# Patient Record
Sex: Male | Born: 1965 | Race: Black or African American | Hispanic: No | Smoking: Never smoker
Health system: Southern US, Community
[De-identification: ages and names within clinical notes are randomized; demographics above are authoritative.]

## PROBLEM LIST (undated history)

## (undated) DIAGNOSIS — E7800 Pure hypercholesterolemia, unspecified: Secondary | ICD-10-CM

## (undated) DIAGNOSIS — I10 Essential (primary) hypertension: Secondary | ICD-10-CM

## (undated) DIAGNOSIS — E119 Type 2 diabetes mellitus without complications: Secondary | ICD-10-CM

## (undated) HISTORY — PX: CARDIAC SURGERY: SHX584

## (undated) HISTORY — PX: TOTAL HIP ARTHROPLASTY: SHX124

## (undated) HISTORY — PX: NEPHRECTOMY TRANSPLANTED ORGAN: SUR880

---

## 2021-06-11 ENCOUNTER — Inpatient Hospital Stay
Admission: EM | Admit: 2021-06-11 | Discharge: 2021-06-13 | DRG: 492 | Disposition: A | Discharge status: Home / Self Care | Payer: 59 | Attending: Internal Medicine | Admitting: Internal Medicine

## 2021-06-11 ENCOUNTER — Emergency Department: Payer: 59

## 2021-06-11 ENCOUNTER — Other Ambulatory Visit: Payer: Self-pay

## 2021-06-11 DIAGNOSIS — Z23 Encounter for immunization: Secondary | ICD-10-CM

## 2021-06-11 DIAGNOSIS — I252 Old myocardial infarction: Secondary | ICD-10-CM | POA: Diagnosis not present

## 2021-06-11 DIAGNOSIS — Z7960 Long term (current) use of unspecified immunomodulators and immunosuppressants: Secondary | ICD-10-CM | POA: Diagnosis not present

## 2021-06-11 DIAGNOSIS — E785 Hyperlipidemia, unspecified: Secondary | ICD-10-CM | POA: Diagnosis present

## 2021-06-11 DIAGNOSIS — S82202A Unspecified fracture of shaft of left tibia, initial encounter for closed fracture: Secondary | ICD-10-CM

## 2021-06-11 DIAGNOSIS — Z880 Allergy status to penicillin: Secondary | ICD-10-CM

## 2021-06-11 DIAGNOSIS — Z881 Allergy status to other antibiotic agents status: Secondary | ICD-10-CM

## 2021-06-11 DIAGNOSIS — S82202B Unspecified fracture of shaft of left tibia, initial encounter for open fracture type I or II: Secondary | ICD-10-CM

## 2021-06-11 DIAGNOSIS — Y929 Unspecified place or not applicable: Secondary | ICD-10-CM

## 2021-06-11 DIAGNOSIS — E119 Type 2 diabetes mellitus without complications: Secondary | ICD-10-CM

## 2021-06-11 DIAGNOSIS — S82832B Other fracture of upper and lower end of left fibula, initial encounter for open fracture type I or II: Secondary | ICD-10-CM | POA: Diagnosis present

## 2021-06-11 DIAGNOSIS — Z794 Long term (current) use of insulin: Secondary | ICD-10-CM

## 2021-06-11 DIAGNOSIS — Y830 Surgical operation with transplant of whole organ as the cause of abnormal reaction of the patient, or of later complication, without mention of misadventure at the time of the procedure: Secondary | ICD-10-CM | POA: Diagnosis present

## 2021-06-11 DIAGNOSIS — N186 End stage renal disease: Secondary | ICD-10-CM | POA: Diagnosis present

## 2021-06-11 DIAGNOSIS — D84821 Immunodeficiency due to drugs: Secondary | ICD-10-CM | POA: Diagnosis present

## 2021-06-11 DIAGNOSIS — Z20822 Contact with and (suspected) exposure to covid-19: Secondary | ICD-10-CM | POA: Diagnosis present

## 2021-06-11 DIAGNOSIS — S82302B Unspecified fracture of lower end of left tibia, initial encounter for open fracture type I or II: Secondary | ICD-10-CM | POA: Diagnosis present

## 2021-06-11 DIAGNOSIS — T8611 Kidney transplant rejection: Secondary | ICD-10-CM | POA: Diagnosis present

## 2021-06-11 DIAGNOSIS — I10 Essential (primary) hypertension: Secondary | ICD-10-CM | POA: Diagnosis present

## 2021-06-11 DIAGNOSIS — Z419 Encounter for procedure for purposes other than remedying health state, unspecified: Secondary | ICD-10-CM

## 2021-06-11 DIAGNOSIS — E1169 Type 2 diabetes mellitus with other specified complication: Secondary | ICD-10-CM | POA: Diagnosis present

## 2021-06-11 DIAGNOSIS — S82402B Unspecified fracture of shaft of left fibula, initial encounter for open fracture type I or II: Secondary | ICD-10-CM

## 2021-06-11 DIAGNOSIS — I120 Hypertensive chronic kidney disease with stage 5 chronic kidney disease or end stage renal disease: Secondary | ICD-10-CM | POA: Diagnosis present

## 2021-06-11 DIAGNOSIS — Z940 Kidney transplant status: Secondary | ICD-10-CM

## 2021-06-11 DIAGNOSIS — T8610 Unspecified complication of kidney transplant: Secondary | ICD-10-CM

## 2021-06-11 DIAGNOSIS — Z96643 Presence of artificial hip joint, bilateral: Secondary | ICD-10-CM | POA: Diagnosis present

## 2021-06-11 DIAGNOSIS — E7800 Pure hypercholesterolemia, unspecified: Secondary | ICD-10-CM | POA: Diagnosis present

## 2021-06-11 HISTORY — DX: Type 2 diabetes mellitus without complications: E11.9

## 2021-06-11 HISTORY — DX: Pure hypercholesterolemia, unspecified: E78.00

## 2021-06-11 HISTORY — DX: Essential (primary) hypertension: I10

## 2021-06-11 LAB — RESP PANEL BY RT-PCR (FLU A&B, COVID) ARPGX2
Influenza A by PCR: NEGATIVE
Influenza B by PCR: NEGATIVE
SARS Coronavirus 2 by RT PCR: NEGATIVE

## 2021-06-11 LAB — CBC
HCT: 44.5 % (ref 39.0–52.0)
Hemoglobin: 14.4 g/dL (ref 13.0–17.0)
MCH: 28 pg (ref 26.0–34.0)
MCHC: 32.4 g/dL (ref 30.0–36.0)
MCV: 86.6 fL (ref 80.0–100.0)
Platelets: 194 10*3/uL (ref 150–400)
RBC: 5.14 MIL/uL (ref 4.22–5.81)
RDW: 12.6 % (ref 11.5–15.5)
WBC: 6.6 10*3/uL (ref 4.0–10.5)
nRBC: 0 % (ref 0.0–0.2)

## 2021-06-11 LAB — GLUCOSE, CAPILLARY: Glucose-Capillary: 113 mg/dL — ABNORMAL HIGH (ref 70–99)

## 2021-06-11 LAB — BASIC METABOLIC PANEL
Anion gap: 6 (ref 5–15)
BUN: 20 mg/dL (ref 6–20)
CO2: 25 mmol/L (ref 22–32)
Calcium: 10.1 mg/dL (ref 8.9–10.3)
Chloride: 106 mmol/L (ref 98–111)
Creatinine, Ser: 1.15 mg/dL (ref 0.61–1.24)
GFR, Estimated: >60 mL/min (ref >60)
Glucose, Bld: 157 mg/dL — ABNORMAL HIGH (ref 70–99)
Potassium: 3.7 mmol/L (ref 3.5–5.1)
Sodium: 137 mmol/L (ref 135–145)

## 2021-06-11 LAB — PROTIME-INR
INR: 1 (ref 0.8–1.2)
Prothrombin Time: 12.8 seconds (ref 11.4–15.2)

## 2021-06-11 LAB — CBG MONITORING, ED: Glucose-Capillary: 119 mg/dL — ABNORMAL HIGH (ref 70–99)

## 2021-06-11 MED ORDER — ONDANSETRON HCL 4 MG/2ML IJ SOLN: 4.0000 mg | Freq: Four times a day (QID) | INTRAMUSCULAR | Status: DC | PRN

## 2021-06-11 MED ORDER — INSULIN ASPART 100 UNIT/ML IJ SOLN
0.0000 [IU] | Freq: Three times a day (TID) | INTRAMUSCULAR | Status: DC
  Administered 2021-06-12: 1 [IU] via SUBCUTANEOUS
  Administered 2021-06-13: 3 [IU] via SUBCUTANEOUS
  Administered 2021-06-13: 5 [IU] via SUBCUTANEOUS
  Administered 2021-06-13: 1 [IU] via SUBCUTANEOUS
  Filled 2021-06-11 (×4): qty 1

## 2021-06-11 MED ORDER — ONDANSETRON HCL 4 MG/2ML IJ SOLN
4.0000 mg | INTRAMUSCULAR | Status: AC
  Administered 2021-06-11: 4 mg via INTRAVENOUS
  Filled 2021-06-11: qty 2

## 2021-06-11 MED ORDER — KETAMINE HCL 50 MG/5ML IJ SOSY
PREFILLED_SYRINGE | INTRAMUSCULAR | Status: AC
  Filled 2021-06-11: qty 5

## 2021-06-11 MED ORDER — ACETAMINOPHEN 325 MG PO TABS: 650.0000 mg | ORAL_TABLET | Freq: Four times a day (QID) | ORAL | Status: DC | PRN

## 2021-06-11 MED ORDER — MAGNESIUM HYDROXIDE 400 MG/5ML PO SUSP: 30.0000 mL | Freq: Every day | ORAL | Status: DC | PRN

## 2021-06-11 MED ORDER — ACETAMINOPHEN 650 MG RE SUPP: 650.0000 mg | Freq: Four times a day (QID) | RECTAL | Status: DC | PRN

## 2021-06-11 MED ORDER — OXYCODONE-ACETAMINOPHEN 5-325 MG PO TABS
1.0000 | ORAL_TABLET | ORAL | Status: DC | PRN
  Administered 2021-06-11: 1 via ORAL
  Administered 2021-06-12: 2 via ORAL
  Filled 2021-06-11 (×2): qty 2

## 2021-06-11 MED ORDER — CEFAZOLIN SODIUM-DEXTROSE 2-4 GM/100ML-% IV SOLN: 2.0000 g | Freq: Once | INTRAVENOUS | Status: DC

## 2021-06-11 MED ORDER — CLINDAMYCIN PHOSPHATE 900 MG/50ML IV SOLN
900.0000 mg | Freq: Once | INTRAVENOUS | Status: AC
  Administered 2021-06-11: 900 mg via INTRAVENOUS
  Filled 2021-06-11: qty 50

## 2021-06-11 MED ORDER — TRAZODONE HCL 50 MG PO TABS
25.0000 mg | ORAL_TABLET | Freq: Every evening | ORAL | Status: DC | PRN
  Administered 2021-06-12: 25 mg via ORAL
  Filled 2021-06-11: qty 1

## 2021-06-11 MED ORDER — ONDANSETRON HCL 4 MG PO TABS: 4.0000 mg | ORAL_TABLET | Freq: Four times a day (QID) | ORAL | Status: DC | PRN

## 2021-06-11 MED ORDER — TETANUS-DIPHTH-ACELL PERTUSSIS 5-2.5-18.5 LF-MCG/0.5 IM SUSY
0.5000 mL | PREFILLED_SYRINGE | Freq: Once | INTRAMUSCULAR | Status: AC
  Administered 2021-06-11: 0.5 mL via INTRAMUSCULAR
  Filled 2021-06-11: qty 0.5

## 2021-06-11 MED ORDER — LABETALOL HCL 5 MG/ML IV SOLN: 20.0000 mg | INTRAVENOUS | Status: DC | PRN

## 2021-06-11 MED ORDER — MORPHINE SULFATE (PF) 2 MG/ML IV SOLN
2.0000 mg | INTRAVENOUS | Status: DC | PRN
  Administered 2021-06-12 (×3): 2 mg via INTRAVENOUS
  Filled 2021-06-11 (×3): qty 1

## 2021-06-11 MED ORDER — SODIUM CHLORIDE 0.9 % IV SOLN: INTRAVENOUS | Status: DC

## 2021-06-11 MED ORDER — MORPHINE SULFATE (PF) 2 MG/ML IV SOLN
2.0000 mg | Freq: Once | INTRAVENOUS | Status: AC
  Administered 2021-06-11: 2 mg via INTRAVENOUS
  Filled 2021-06-11: qty 1

## 2021-06-11 MED ORDER — KETAMINE HCL 50 MG/5ML IJ SOSY
1.0000 mg/kg | PREFILLED_SYRINGE | Freq: Once | INTRAMUSCULAR | Status: AC
  Administered 2021-06-11: 82 mg via INTRAVENOUS
  Filled 2021-06-11: qty 10

## 2021-06-11 NOTE — ED Triage Notes (Signed)
Pt comes via EMS with c/o left leg injury. Pt was helping load things when his family accidentally back the [place] and his leg got caught between the ramp and steps.  VSS. Pt refused pain meds en route.  Pt has noticeable fracture to left lower leg. Laceration noted, bleeding controlled.

## 2021-06-11 NOTE — ED Notes (Signed)
X-ray at bedside

## 2021-06-11 NOTE — Sedation Documentation (Signed)
Pedal pulse found by [employer], Medic. Sharlet Salina, MD aware.

## 2021-06-11 NOTE — H&P (Signed)
[place]   PATIENT NAMEJahquan Riggs    MR#:  [id number]  DATE OF BIRTH:  02-May-1966  DATE OF ADMISSION:  06/11/2021  PRIMARY CARE PHYSICIAN: Pcp, No   Patient is coming from: Home  REQUESTING/REFERRING PHYSICIAN: Delman Kitten, MD  CHIEF COMPLAINT:   Chief Complaint  Patient presents with  . Leg Injury    HISTORY OF PRESENT ILLNESS:  James Riggs is a 55 y.o. African-American male with medical history significant for type II diabetes mellitus, history of MI, hypertension, dyslipidemia and end-stage renal disease status post renal transplant on immunosuppressive therapy, who presented to the ER with acute onset of left leg injury and she was helping in moving earlier today.  The moving Lucianne Lei with his left leg down back into his left leg and pended with subsequent immediate pain.  He did not fall.  He was freed easily with subsequent severe deformity at his left ankle.  He denied any weakness or numbness of the left foot.  ED Course: When he came to the ER, vital signs were within normal and later blood pressure was elevated to 104/149 likely secondary to pain and later on was within normal.  Labs revealed a blood glucose of 157 with otherwise unremarkable BMP and CBC.  Influenza antigens and COVID-19 PCR came back negative.   EKG as reviewed by me : Pending Imaging: Left tib-fib x-ray revealed displaced and angulated fracture of the distal tibial and fibular diaphysis with no dislocation.  He underwent reduction with IV ketamine and pain therapy.  Repeat x-ray showed improved angulation of the distal tibia and fibula fracture with unchanged lateral displacement.  The patient was given 50 mg of IV ketamine, 4 mg of IV Zofran and 2 mg of IV morphine sulfate.  His Tdap was updated.  He was seen by Dr. Sharlet Salina who was planning on ORIF tomorrow.  The patient will be admitted to surgical telemetry bed for further evaluation and management. PAST MEDICAL HISTORY:   Past Medical  History:  Diagnosis Date  . Diabetes mellitus without complication ([place])   . Hypercholesteremia   . Hypertension     PAST SURGICAL HISTORY:   Past Surgical History:  Procedure Laterality Date  . CARDIAC SURGERY    . NEPHRECTOMY TRANSPLANTED ORGAN    . TOTAL HIP ARTHROPLASTY Bilateral     SOCIAL HISTORY:   Social History   Tobacco Use  . Smoking status: Not on file  . Smokeless tobacco: Not on file  Substance Use Topics  . Alcohol use: Not on file    FAMILY HISTORY:  No family history on file.  No pertinent familial diseases.  DRUG ALLERGIES:   Allergies  Allergen Reactions  . Aspirin   . Elemental Sulfur   . Penicillins   . Sulfa Antibiotics   . Sulfites   . Sulfonylureas   . Tetracyclines & Related     REVIEW OF SYSTEMS:   ROS As per history of present illness. All pertinent systems were reviewed above. Constitutional, HEENT, cardiovascular, respiratory, GI, GU, musculoskeletal, neuro, psychiatric, endocrine, integumentary and hematologic systems were reviewed and are otherwise negative/unremarkable except for positive findings mentioned above in the HPI.   MEDICATIONS AT HOME:   Prior to Admission medications   Not on File      VITAL SIGNS:  Blood pressure 121/83, pulse 85, temperature 98.3 F (36.8 C), temperature source Oral, resp. rate 16, height 5\' 9"  (1.753 m), weight 81.6 kg, SpO2 96 %.  PHYSICAL  EXAMINATION:  Physical Exam  GENERAL:  55 y.o.-year-old African-American male patient lying in the bed with no acute distress.  EYES: Pupils equal, round, reactive to light and accommodation. No scleral icterus. Extraocular muscles intact.  HEENT: Head atraumatic, normocephalic. Oropharynx and nasopharynx clear.  NECK:  Supple, no jugular venous distention. No thyroid enlargement, no tenderness.  LUNGS: Normal breath sounds bilaterally, no wheezing, rales,rhonchi or crepitation. No use of accessory muscles of respiration.  CARDIOVASCULAR: Regular  rate and rhythm, S1, S2 normal. No murmurs, rubs, or gallops.  ABDOMEN: Soft, nondistended, nontender. Bowel sounds present. No organomegaly or mass.  EXTREMITIES: No pedal edema, cyanosis, or clubbing. Musculoskeletal: Left lower extremity was splinted. NEUROLOGIC: Cranial nerves II through XII are intact. Muscle strength 5/5 in all extremities. Sensation intact. Gait not checked.  PSYCHIATRIC: The patient is alert and oriented x 3.  Normal affect and good eye contact. SKIN: No obvious rash, lesion, or ulcer.   LABORATORY PANEL:   CBC Recent Labs  Lab 06/11/21 1739  WBC 6.6  HGB 14.4  HCT 44.5  PLT 194   ------------------------------------------------------------------------------------------------------------------  Chemistries  Recent Labs  Lab 06/11/21 1739  NA 137  K 3.7  CL 106  CO2 25  GLUCOSE 157*  BUN 20  CREATININE 1.15  CALCIUM 10.1   ------------------------------------------------------------------------------------------------------------------  Cardiac Enzymes No results for input(s): TROPONINI in the last 168 hours. ------------------------------------------------------------------------------------------------------------------  RADIOLOGY:  DG Tibia/Fibula Left  Result Date: 06/11/2021 CLINICAL DATA:  Status post reduction EXAM: LEFT TIBIA AND FIBULA - 2 VIEW COMPARISON:  None. FINDINGS: Improved angulation of distal tibia and fibula fractures. There is unchanged lateral displacement of both fractures. There is mild persistent anterior angulation. IMPRESSION: Improved angulation of distal tibia and fibula fractures. Unchanged lateral displacement. Electronically Signed   By: Ulyses Jarred M.D.   On: 06/11/2021 19:41   DG Tibia/Fibula Left  Result Date: 06/11/2021 CLINICAL DATA:  Fracture of the left lower extremity EXAM: LEFT TIBIA AND FIBULA - 2 VIEW COMPARISON:  None. FINDINGS: Displaced and angulated fractures of the distal tibial and fibular  diaphysis. There is lateral angulation and displacement of the distal fracture fragments as well as anterior displacement. No dislocation. The bones are well mineralized. Sclerotic changes of the lateral femoral condyle may be chronic or represent bone contusion versus less likely infarct. Mild soft tissue swelling of the distal leg. No opaque foreign object or soft tissue gas. IMPRESSION: Displaced and angulated fractures of the distal tibial and fibular diaphysis. No dislocation. Electronically Signed   By: Anner Crete M.D.   On: 06/11/2021 17:25      IMPRESSION AND PLAN:  Principal Problem:   Tibia/fibula fracture, left, closed, initial encounter  1.  Open left tibia/fibula fracture. - The patient will be admitted to a surgical telemetry bed. - Pain management will be provided. - Orthopedic consultation was obtained. - Dr. Sharlet Salina is planning for ORIF tomorrow. - The patient has diabetes mellitus on insulin with previous history of MI and no history of CHF, CVA or renal failure.  Per the revised cardiac risk index he is considered above average risk for his age for cardiovascular events.  He has no current pulmonary issues.  He is currently asymptomatic apart from his left lower extremity pain.  2.  Status post renal transplant. - We will continue his immunosuppressive therapy.  3.  Type 2 diabetes mellitus. - The patient will be placed on supplement coverage with NovoLog. - We will continue his basal coverage and his Jardiance.  4.  Dyslipidemia. - We will continue statin therapy.  5.  Essential hypertension. - We will continue Cozaar and hydralazine.   DVT prophylaxis: SCDs. Code Status: full code. Family Communication:  The plan of care was discussed in details with the patient (and family). I answered all questions. The patient agreed to proceed with the above mentioned plan. Further management will depend upon hospital course. Disposition Plan: Back to previous home  environment Consults called: Orthopedic consult. All the records are reviewed and case discussed with ED provider.  Status is: Inpatient   Remains inpatient appropriate because:Ongoing diagnostic testing needed not appropriate for outpatient work up, Unsafe d/c plan, IV treatments appropriate due to intensity of illness or inability to take PO, and Inpatient level of care appropriate due to severity of illness   Dispo: The patient is from: Home              Anticipated d/c is to: Home              Patient currently is not medically stable to d/c.              Difficult to place patient: No  TOTAL TIME TAKING CARE OF THIS PATIENT: 55 minutes.     Christel Mormon M.D on 06/11/2021 at 8:42 PM  [facility]   From 7 PM-7 AM, contact night-coverage [email or web address]  CC: Primary care physician; Pcp, No

## 2021-06-11 NOTE — ED Notes (Signed)
Multiple attempts for IV with no success. IV team consult placed at this time.

## 2021-06-11 NOTE — Consult Note (Signed)
ORTHOPAEDIC CONSULTATION  REQUESTING PHYSICIAN: Mansy, Arvella Merles, MD  Chief Complaint: Left tibia fracture  HPI: James Riggs is a 55 y.o. male who complains of left leg pain after a mechanical fall earlier today. The pain is sharp in character. There is notable deformity. Denies any numbness, tingling or constitutional symptoms.  PMH notable for kidney transplant, b/l THA. Orthopedics was consulted for surgical management.  Past Medical History:  Diagnosis Date   Diabetes mellitus without complication ([place])    Hypercholesteremia    Hypertension    Past Surgical History:  Procedure Laterality Date   CARDIAC SURGERY     NEPHRECTOMY TRANSPLANTED ORGAN     TOTAL HIP ARTHROPLASTY Bilateral    Social History   Socioeconomic History   Marital status: Unknown    Spouse name: Not on file   Number of children: Not on file   Years of education: Not on file   Highest education level: Not on file  Occupational History   Not on file  Tobacco Use   Smoking status: Not on file   Smokeless tobacco: Not on file  Substance and Sexual Activity   Alcohol use: Not on file   Drug use: Not on file   Sexual activity: Not on file  Other Topics Concern   Not on file  Social History Narrative   Not on file   Social Determinants of Health   Financial Resource Strain: Not on file  Food Insecurity: Not on file  Transportation Needs: Not on file  Physical Activity: Not on file  Stress: Not on file  Social Connections: Not on file   No family history on file. Allergies  Allergen Reactions   Aspirin    Elemental Sulfur    Penicillins    Sulfa Antibiotics    Sulfites    Sulfonylureas    Tetracyclines & Related    Prior to Admission medications   Not on File   DG Tibia/Fibula Left  Result Date: 06/11/2021 CLINICAL DATA:  Status post reduction EXAM: LEFT TIBIA AND FIBULA - 2 VIEW COMPARISON:  None. FINDINGS: Improved angulation of distal tibia and fibula fractures. There is  unchanged lateral displacement of both fractures. There is mild persistent anterior angulation. IMPRESSION: Improved angulation of distal tibia and fibula fractures. Unchanged lateral displacement. Electronically Signed   By: Ulyses Jarred M.D.   On: 06/11/2021 19:41   DG Tibia/Fibula Left  Result Date: 06/11/2021 CLINICAL DATA:  Fracture of the left lower extremity EXAM: LEFT TIBIA AND FIBULA - 2 VIEW COMPARISON:  None. FINDINGS: Displaced and angulated fractures of the distal tibial and fibular diaphysis. There is lateral angulation and displacement of the distal fracture fragments as well as anterior displacement. No dislocation. The bones are well mineralized. Sclerotic changes of the lateral femoral condyle may be chronic or represent bone contusion versus less likely infarct. Mild soft tissue swelling of the distal leg. No opaque foreign object or soft tissue gas. IMPRESSION: Displaced and angulated fractures of the distal tibial and fibular diaphysis. No dislocation. Electronically Signed   By: Anner Crete M.D.   On: 06/11/2021 17:25    Positive ROS: All other systems have been reviewed and were otherwise negative with the exception of those mentioned in the HPI and as above.  Physical Exam: General: Alert, no acute distress Cardiovascular: No pedal edema Respiratory: No cyanosis, no use of accessory musculature GI: No organomegaly, abdomen is soft and non-tender Skin: No lesions in the area of chief complaint Neurologic: Sensation intact distally  Psychiatric: Patient is competent for consent with normal mood and affect Lymphatic: No axillary or cervical lymphadenopathy  MUSCULOSKELETAL:  Left lower extremity: notable bony deformity of the distal tibia. Compartments x4 are soft and compressible. Superficial skin sloughing medially and laterally. <46mm pokehole laterally with active bloody oozing. Prior to fracture reduction, DP/PT pulse were unable to be palpated, however there was a  palpable DP pulse following reduction. Cap refill < 2 sec LLE is grossly NVI  PROCEDURE NOTE After informed consent was provided, conscious sedation was administered. 500cc NS was used to irrigate the <49mm pokehole wound laterally. Xeroform applied. The fracture was gently reduced. A long leg splint was applied. DP pulse was able to be palpated following fracture reduction. The patient tolerated this well.  Assessment: 55yo M with displaced left distal tibia/fibula shaft fracture, <61mm pokehole soft tissue injury laterally, question of potential communication with underlying fracture  Plan: - Appreciate hospitalist team support/management - The fracture was reduced in the ER. See procedure note. - Treatment options discussed with the patient. Plan for Left tibia intramedullary nail 12/1. Will assess poke-hole wound and irrigate if needed. - Please make NPO at midnight.    [name], MD    06/11/2021 9:54 PM

## 2021-06-11 NOTE — ED Provider Notes (Signed)
[name]   ____________________________________________   Event Date/Time   First MD Initiated Contact with Patient 06/11/21 1601     (approximate)  I have reviewed the triage vital signs and the nursing notes.   HISTORY  Chief Complaint Leg Injury  EM caveat: Limited by concern for ischemic limb  HPI James Riggs is a 55 y.o. male has been in normal health.  He was helping move.  The moving Lucianne Lei with the left down backed into his left leg.  It pinned his left leg and he had immediate pain.  He did not fall.  He was able to be freed easily but has severe deformity and knows that he broke his left ankle.  Denies numbness or weakness in the left foot.  Tetanus shot not known.  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  Did not fall or become injured elsewhere.  No injury to his right leg.  All pain and injury seems to be isolated to his lower left leg left ankle and left foot region  He is also diabetic  Past Medical History:  Diagnosis Date   Diabetes mellitus without complication ([place])    Hypercholesteremia    Hypertension     Patient Active Problem List   Diagnosis Date Noted   Tibia/fibula fracture, left, closed, initial encounter 06/11/2021     Past Surgical History:  Procedure Laterality Date   CARDIAC SURGERY     NEPHRECTOMY TRANSPLANTED ORGAN     TOTAL HIP ARTHROPLASTY Bilateral     Prior to Admission medications   Not on File    Allergies Aspirin, Elemental sulfur, Penicillins, Sulfa antibiotics, Sulfites, Sulfonylureas, and Tetracyclines & related  No family history on file.  Social History    Review of Systems  EM caveat  Constitutional: No fever/chills or recent illness Cardiovascular: Denies chest  pain. Respiratory: Denies shortness of breath. Musculoskeletal: Negative for pain or injury except around his left ankle Skin: Negative for rash. Neurological: Negative for weakness or numbness specially involving the left foot.    ____________________________________________   PHYSICAL EXAM:  VITAL SIGNS: ED Triage Vitals  Enc Vitals Group     BP 06/11/21 1616 118/83     Pulse Rate 06/11/21 1616 95     Resp 06/11/21 1616 18     Temp 06/11/21 1616 98.1 F (36.7 C)     Temp src --      SpO2 06/11/21 1616 98 %     Weight --      Height --      Head Circumference --      Peak Flow --      Pain Score 06/11/21 1614 10     Pain Loc --      Pain Edu? --      Excl. in [place]? --     Constitutional: Alert and oriented.  In no acute distress, but appears in pain with any type of movement that would involve his left ankle.  Left ankle currently splinted with a prefabricated sam splint. Eyes: Conjunctivae are normal. Head: Atraumatic. Nose: No congestion/rhinnorhea. Mouth/Throat: Mucous membranes are moist. Neck: No stridor.  Cardiovascular: Normal rate, regular rhythm. Grossly normal heart sounds.  Good peripheral circulation. Respiratory: Normal respiratory effort.  No retractions. Lungs CTAB.  Gastrointestinal: Soft and nontender. No distention. Musculoskeletal:   Lower Extremities  No edema. Normal DP/PT pulses in the right foot with good cap refill.  Normal neuro-motor function lower extremities bilateral.  RIGHT Right lower extremity demonstrates normal strength, good use of all muscles. No edema bruising or contusions of the right hip, right knee, right ankle. Full range of motion of the right lower extremity without pain. No pain on axial loading. No evidence of trauma.  LEFT Left lower extremity demonstrates normal strength, good use of all muscles except as obviously limited by pain. No edema bruising or contusions of the hip,  knee.  Left distal tib-fib demonstrates a  abrasion as well as skin tear that is linear in nature, and along with this is apparent deformity.  He is splinted in a somewhat good alignment, but there is obvious evidence of what appears to be a likely tib-fib fracture or potential ankle dislocation.  Dorsalis pedis and posterior tibial pulses are not dopplerable in the left foot does feel just slightly cool compared to the right.  Capillary refill of the digits the left toe does however appear to be present and approximately 1 second.  He reports able to discern touch over the left foot all digits the left foot without difficulty.  He is able to wiggle all toes the left foot without difficulty.  There is no evidence of deep laceration or open fracture of the left lower extremity but obvious abrasion with some slight amount of bleeding.    Neurologic:  Normal speech and language. No gross focal neurologic deficits are appreciated.  Skin:  Skin is warm, dry and intact. No rash noted. Psychiatric: Mood and affect are normal. Speech and behavior are normal.  ____________________________________________   LABS (all labs ordered are listed, but only abnormal results are displayed)  Labs Reviewed  BASIC METABOLIC PANEL - Abnormal; Notable for the following components:      Result Value   Glucose, Bld 157 (*)    All other components within normal limits  CBG MONITORING, ED - Abnormal; Notable for the following components:   Glucose-Capillary 119 (*)    All other components within normal limits  RESP PANEL BY RT-PCR (FLU A&B, COVID) ARPGX2  CBC  PROTIME-INR   ____________________________________________  EKG   ____________________________________________  RADIOLOGY  DG Tibia/Fibula Left  Result Date: 06/11/2021 CLINICAL DATA:  Status post reduction EXAM: LEFT TIBIA AND FIBULA - 2 VIEW COMPARISON:  None. FINDINGS: Improved angulation of distal tibia and fibula fractures. There is unchanged lateral displacement of both fractures.  There is mild persistent anterior angulation. IMPRESSION: Improved angulation of distal tibia and fibula fractures. Unchanged lateral displacement. Electronically Signed   By: Ulyses Jarred M.D.   On: 06/11/2021 19:41   DG Tibia/Fibula Left  Result Date: 06/11/2021 CLINICAL DATA:  Fracture of the left lower extremity EXAM: LEFT TIBIA AND FIBULA - 2 VIEW COMPARISON:  None. FINDINGS: Displaced and angulated fractures of the distal tibial and fibular diaphysis. There is lateral angulation and displacement of the distal fracture fragments as well as anterior displacement. No dislocation. The bones are well mineralized. Sclerotic changes of the lateral femoral condyle may be chronic or represent bone contusion versus less likely infarct. Mild soft tissue swelling of the distal leg. No opaque foreign object or soft tissue gas. IMPRESSION: Displaced and angulated fractures of the distal tibial and fibular diaphysis. No dislocation. Electronically Signed   By: Anner Crete M.D.   On: 06/11/2021 17:25  ____________________________________________   PROCEDURES  Procedure(s) performed:  sedation  .Sedation  Date/Time: 06/11/2021 5:55 PM Performed by: Delman Kitten, MD Authorized by: Delman Kitten, MD   Consent:    Consent obtained:  Verbal and written   Consent given by:  Patient   Risks discussed:  Allergic reaction, inadequate sedation, nausea, vomiting, respiratory compromise necessitating ventilatory assistance and intubation, prolonged hypoxia resulting in organ damage and dysrhythmia   Alternatives discussed:  Analgesia without sedation Universal protocol:    Procedure explained and questions answered to patient or proxy's satisfaction: yes     Relevant documents present and verified: yes     Test results available: yes     Imaging studies available: yes     Required blood products, implants, devices, and special equipment available: yes     Site/side marked: yes     Immediately prior  to procedure, a time out was called: yes     Patient identity confirmed:  Arm band, verbally with patient and provided demographic data Indications:    Procedure performed:  Fracture reduction   Procedure necessitating sedation performed by:  Physician performing sedation Pre-sedation assessment:    Time since last food or drink:  2 hours prior to ER arrival   NPO status caution: urgency dictates proceeding with non-ideal NPO status     ASA classification: class 2 - patient with mild systemic disease     Mouth opening:  3 or more finger widths   Thyromental distance:  3 finger widths   Mallampati score:  II - soft palate, uvula, fauces visible   Neck mobility: normal     Pre-sedation assessments completed and reviewed: airway patency, cardiovascular function, hydration status, mental status, nausea/vomiting, pain level, respiratory function and temperature     Pre-sedation assessment completed:  06/11/2021 5:56 PM Immediate pre-procedure details:    Reassessment: Patient reassessed immediately prior to procedure     Reviewed: vital signs, relevant labs/tests and NPO status     Verified: bag valve mask available, emergency equipment available, intubation equipment available, IV patency confirmed, oxygen available and suction available   Procedure details (see MAR for exact dosages):    Preoxygenation:  Nasal cannula   Sedation:  Ketamine   Intended level of sedation: deep   Intra-procedure monitoring:  Blood pressure monitoring, cardiac monitor, continuous capnometry, continuous pulse oximetry, frequent LOC assessments and frequent vital sign checks   Intra-procedure events: none     Total Provider sedation time (minutes):  25 Post-procedure details:    Post-sedation assessment completed:  06/11/2021 7:45 PM   Attendance: Constant attendance by certified staff until patient recovered     Recovery: Patient returned to pre-procedure baseline     Post-sedation assessments completed and  reviewed: airway patency, cardiovascular function, hydration status, mental status, nausea/vomiting, pain level, respiratory function and temperature     Post-sedation assessments completed and reviewed comment:  Pain well controlled does not wish for any medication at this time   Patient is stable for discharge or admission: yes     Procedure completion:  Tolerated well, no immediate complications   Reduction of the left tib-fib injury was performed primarily by Dr. Lockie Mola was at the bedside while I administered patient's sedation.  I did assist in bandaging and splinting of the left lower extremity with long-leg posterior splint.  Patient tolerated well without complication.  Critical Care performed: Yes, see critical care note(s)  CRITICAL CARE Performed by: Delman Kitten   Total critical care time: 25 minutes  Critical care time was exclusive of separately billable procedures and treating other patients.  Critical care was necessary to treat or prevent imminent or life-threatening deterioration.  Critical care was time spent personally by me on the following activities: development of treatment plan with patient and/or surrogate as well as nursing, discussions with consultants, evaluation of patient's response to treatment, examination of patient, obtaining history from patient or surrogate, ordering and performing treatments and interventions, ordering and review of laboratory studies, ordering and review of radiographic studies, pulse oximetry and re-evaluation of patient's condition.  Patient presents with left tib-fib fracture, concerning for possible poke hole open fracture.  During sedation patient was irrigated for 1 L of normal saline over his left ankle wound bandaged with Vaseline gauze prior to splinting.  Patient was examined by Dr. Sharlet Salina at that time as well during the sedation it was determined the patient may have a small poke hole type open fracture.  Discussed with  pharmacy given the patient's allergy history and will cover with high-dose IV clindamycin ____________________________________________   INITIAL IMPRESSION / ASSESSMENT AND PLAN / ED COURSE  Pertinent labs & imaging results that were available during my care of the patient were reviewed by me and considered in my medical decision making (see chart for details).   Isolated injury left lower leg with obvious fracture deformity  Clinical Course as of 06/11/21 2002  Wed Jun 11, 2021  1619 Paged Ortho MD re: suspected vascular compromise of the LEFT foot.  [MQ]  1620 Awaiting x-ray/imaging.  [MQ]  9470 No dopplerable posterior tibial or dorsalis pedis noted on LEFT at this time. However, doppler + anterior tibial artery signal is present.  [MQ]  1627 [facility]UC awaiting call back from Dr. Sharlet Salina of Ortho.  [MQ]  9628 In anticipation of a potential need for emergent fracture management including reduction potential need for sedation, I discussed with the patient.  Nurse Ria Comment at the bedside as well.  Having confirmed the patient's demographic data, he is agreeable and consenting to reduction attempt as well as sedation.  Risks benefits including risks of nonsuccessful around reduction attempt, further injury damage and need for operative management, as well as the risks of sedation including potential heart attack respiratory distress need for intubation or risk all the way up into death discussed with the patient.  Patient is verbally agreeable to proceeding [MQ]  [place] making additional attempt to contact Dr. [name]. No call back yet.  [MQ]  [place], they advised coming shortly. Had other images pending. [MQ]  1944 Patient post sedation and reduction now fully awake and alert conversant without distress normal hemodynamics.  Left foot now with palpable dorsalis pedis pulse normal capillary refill wiggles all toes well and reports normal sensation. [MQ]    Clinical Course User  Index [MQ] Delman Kitten, MD   DG Tibia/Fibula Left  Result Date: 06/11/2021 CLINICAL DATA:  Status post reduction EXAM: LEFT TIBIA AND FIBULA - 2 VIEW COMPARISON:  None. FINDINGS: Improved angulation of distal tibia and fibula fractures. There is unchanged lateral displacement of both fractures. There is mild persistent anterior angulation. IMPRESSION: Improved angulation of distal tibia and fibula fractures. Unchanged lateral displacement. Electronically Signed   By: Ulyses Jarred M.D.   On: 06/11/2021 19:41   DG Tibia/Fibula Left  Result Date: 06/11/2021 CLINICAL DATA:  Fracture of the left lower extremity EXAM: LEFT TIBIA AND FIBULA - 2 VIEW COMPARISON:  None. FINDINGS: Displaced and angulated fractures of the distal tibial  and fibular diaphysis. There is lateral angulation and displacement of the distal fracture fragments as well as anterior displacement. No dislocation. The bones are well mineralized. Sclerotic changes of the lateral femoral condyle may be chronic or represent bone contusion versus less likely infarct. Mild soft tissue swelling of the distal leg. No opaque foreign object or soft tissue gas. IMPRESSION: Displaced and angulated fractures of the distal tibial and fibular diaphysis. No dislocation. Electronically Signed   By: Anner Crete M.D.   On: 06/11/2021 17:25    Reviewed imaging, improved angulation of the distal tibia and fibular fractures.  Remaining lateral displacement is present.   Patient admitted to hospitalist service with hospitalist (dr. Sidney Ace) and Dr. Jolaine Artist consulting ____________________________________________   FINAL CLINICAL IMPRESSION(S) / ED DIAGNOSES  Final diagnoses:  Type I or II open fracture of left tibia and fibula, initial encounter        Note:  This document was prepared using Dragon voice recognition software and may include unintentional dictation errors       Delman Kitten, MD 06/11/21 2002

## 2021-06-11 NOTE — Sedation Documentation (Addendum)
Sharlet Salina, MD and Jacqualine Code, MD at bedside.

## 2021-06-11 NOTE — Sedation Documentation (Signed)
20 mg ketamine given at this time by Jacqualine Code, MD

## 2021-06-11 NOTE — Progress Notes (Signed)
At bedside, deferred treatment at this time to XR and MD.  Will proceed once able.

## 2021-06-12 ENCOUNTER — Inpatient Hospital Stay: Payer: 59

## 2021-06-12 ENCOUNTER — Encounter: Payer: Self-pay | Admitting: Family Medicine

## 2021-06-12 ENCOUNTER — Encounter
Admission: EM | Disposition: A | Discharge status: Home / Self Care | Payer: Self-pay | Source: Home / Self Care | Attending: Internal Medicine

## 2021-06-12 ENCOUNTER — Inpatient Hospital Stay: Payer: 59 | Admitting: Anesthesiology

## 2021-06-12 DIAGNOSIS — S82202A Unspecified fracture of shaft of left tibia, initial encounter for closed fracture: Secondary | ICD-10-CM

## 2021-06-12 DIAGNOSIS — E785 Hyperlipidemia, unspecified: Secondary | ICD-10-CM

## 2021-06-12 DIAGNOSIS — E1169 Type 2 diabetes mellitus with other specified complication: Secondary | ICD-10-CM | POA: Diagnosis present

## 2021-06-12 DIAGNOSIS — S82402A Unspecified fracture of shaft of left fibula, initial encounter for closed fracture: Secondary | ICD-10-CM

## 2021-06-12 DIAGNOSIS — I10 Essential (primary) hypertension: Secondary | ICD-10-CM | POA: Diagnosis present

## 2021-06-12 DIAGNOSIS — Z940 Kidney transplant status: Secondary | ICD-10-CM

## 2021-06-12 HISTORY — PX: TIBIA IM NAIL INSERTION: SHX2516

## 2021-06-12 LAB — CBC
HCT: 41.6 % (ref 39.0–52.0)
Hemoglobin: 13.8 g/dL (ref 13.0–17.0)
MCH: 28.9 pg (ref 26.0–34.0)
MCHC: 33.2 g/dL (ref 30.0–36.0)
MCV: 87 fL (ref 80.0–100.0)
Platelets: 172 10*3/uL (ref 150–400)
RBC: 4.78 MIL/uL (ref 4.22–5.81)
RDW: 12.6 % (ref 11.5–15.5)
WBC: 8.9 10*3/uL (ref 4.0–10.5)
nRBC: 0 % (ref 0.0–0.2)

## 2021-06-12 LAB — GLUCOSE, CAPILLARY
Glucose-Capillary: 137 mg/dL — ABNORMAL HIGH (ref 70–99)
Glucose-Capillary: 96 mg/dL (ref 70–99)
Glucose-Capillary: 78 mg/dL (ref 70–99)
Glucose-Capillary: 121 mg/dL — ABNORMAL HIGH (ref 70–99)
Glucose-Capillary: 94 mg/dL (ref 70–99)
Glucose-Capillary: 119 mg/dL — ABNORMAL HIGH (ref 70–99)

## 2021-06-12 LAB — BASIC METABOLIC PANEL
Anion gap: 3 — ABNORMAL LOW (ref 5–15)
BUN: 17 mg/dL (ref 6–20)
CO2: 25 mmol/L (ref 22–32)
Calcium: 9.8 mg/dL (ref 8.9–10.3)
Chloride: 109 mmol/L (ref 98–111)
Creatinine, Ser: 1.03 mg/dL (ref 0.61–1.24)
GFR, Estimated: >60 mL/min (ref >60)
Glucose, Bld: 125 mg/dL — ABNORMAL HIGH (ref 70–99)
Potassium: 4 mmol/L (ref 3.5–5.1)
Sodium: 137 mmol/L (ref 135–145)

## 2021-06-12 LAB — HIV ANTIBODY (ROUTINE TESTING W REFLEX): HIV Screen 4th Generation wRfx: NONREACTIVE

## 2021-06-12 SURGERY — INSERTION, INTRAMEDULLARY ROD, TIBIA
Anesthesia: General | Laterality: Left

## 2021-06-12 MED ORDER — DEXAMETHASONE SODIUM PHOSPHATE 10 MG/ML IJ SOLN
INTRAMUSCULAR | Status: DC | PRN
  Administered 2021-06-12: 5 mg via INTRAVENOUS

## 2021-06-12 MED ORDER — BACITRACIN-NEOMYCIN-POLYMYXIN 400-5-5000 EX OINT
TOPICAL_OINTMENT | CUTANEOUS | Status: AC
  Filled 2021-06-12: qty 4

## 2021-06-12 MED ORDER — ROCURONIUM BROMIDE 100 MG/10ML IV SOLN
INTRAVENOUS | Status: DC | PRN
Start: 2021-06-12 — End: 2021-06-12
  Administered 2021-06-12: 40 mg via INTRAVENOUS
  Administered 2021-06-12: 50 mg via INTRAVENOUS
  Administered 2021-06-12: 10 mg via INTRAVENOUS

## 2021-06-12 MED ORDER — TACROLIMUS 0.5 MG PO CAPS
0.5000 mg | ORAL_CAPSULE | Freq: Two times a day (BID) | ORAL | Status: DC
  Administered 2021-06-12 – 2021-06-13 (×2): 0.5 mg via ORAL
  Filled 2021-06-12 (×4): qty 1

## 2021-06-12 MED ORDER — PROPOFOL 10 MG/ML IV BOLUS
INTRAVENOUS | Status: AC
  Filled 2021-06-12: qty 20

## 2021-06-12 MED ORDER — SENNA 8.6 MG PO TABS
1.0000 | ORAL_TABLET | Freq: Two times a day (BID) | ORAL | Status: DC
  Administered 2021-06-13: 8.6 mg via ORAL
  Filled 2021-06-12 (×2): qty 1

## 2021-06-12 MED ORDER — METHOCARBAMOL 500 MG PO TABS: 500.0000 mg | ORAL_TABLET | Freq: Four times a day (QID) | ORAL | Status: DC | PRN

## 2021-06-12 MED ORDER — PREDNISONE 10 MG PO TABS
5.0000 mg | ORAL_TABLET | Freq: Every day | ORAL | Status: DC
  Administered 2021-06-13: 5 mg via ORAL
  Filled 2021-06-12: qty 1

## 2021-06-12 MED ORDER — ASPIRIN 81 MG PO CHEW
81.0000 mg | CHEWABLE_TABLET | Freq: Every day | ORAL | Status: DC
  Administered 2021-06-12 – 2021-06-13 (×2): 81 mg via ORAL
  Filled 2021-06-12 (×2): qty 1

## 2021-06-12 MED ORDER — FENTANYL CITRATE (PF) 100 MCG/2ML IJ SOLN
INTRAMUSCULAR | Status: AC
  Filled 2021-06-12: qty 2

## 2021-06-12 MED ORDER — PHENYLEPHRINE HCL (PRESSORS) 10 MG/ML IV SOLN
INTRAVENOUS | Status: DC | PRN
  Administered 2021-06-12 (×2): 80 ug via INTRAVENOUS

## 2021-06-12 MED ORDER — PROPOFOL 10 MG/ML IV BOLUS
INTRAVENOUS | Status: DC | PRN
  Administered 2021-06-12: 150 mg via INTRAVENOUS

## 2021-06-12 MED ORDER — DEXTROSE 50 % IV SOLN
INTRAVENOUS | Status: AC
  Administered 2021-06-12: 12.5 g via INTRAVENOUS
  Filled 2021-06-12: qty 50

## 2021-06-12 MED ORDER — ACETAMINOPHEN 10 MG/ML IV SOLN
INTRAVENOUS | Status: DC | PRN
  Administered 2021-06-12: 1000 mg via INTRAVENOUS

## 2021-06-12 MED ORDER — PHENOL 1.4 % MT LIQD
1.0000 | OROMUCOSAL | Status: DC | PRN
  Filled 2021-06-12: qty 177

## 2021-06-12 MED ORDER — ALUM & MAG HYDROXIDE-SIMETH 200-200-20 MG/5ML PO SUSP: 30.0000 mL | ORAL | Status: DC | PRN

## 2021-06-12 MED ORDER — HYDRALAZINE HCL 50 MG PO TABS
50.0000 mg | ORAL_TABLET | Freq: Three times a day (TID) | ORAL | Status: DC
  Administered 2021-06-12: 50 mg via ORAL
  Filled 2021-06-12 (×2): qty 1

## 2021-06-12 MED ORDER — OXYCODONE HCL 5 MG PO TABS: 5.0000 mg | ORAL_TABLET | ORAL | Status: DC | PRN

## 2021-06-12 MED ORDER — CLINDAMYCIN PHOSPHATE 600 MG/50ML IV SOLN
INTRAVENOUS | Status: AC
  Filled 2021-06-12: qty 50

## 2021-06-12 MED ORDER — POLYETHYLENE GLYCOL 3350 17 G PO PACK: 17.0000 g | PACK | Freq: Every day | ORAL | Status: DC | PRN

## 2021-06-12 MED ORDER — NEOMYCIN-POLYMYXIN B GU 40-200000 IR SOLN
Status: AC
  Filled 2021-06-12: qty 4

## 2021-06-12 MED ORDER — CEFAZOLIN SODIUM-DEXTROSE 2-4 GM/100ML-% IV SOLN
2.0000 g | Freq: Once | INTRAVENOUS | Status: DC
Start: 2021-06-12 — End: 2021-06-12

## 2021-06-12 MED ORDER — ONDANSETRON HCL 4 MG/2ML IJ SOLN: 4.0000 mg | Freq: Four times a day (QID) | INTRAMUSCULAR | Status: DC | PRN

## 2021-06-12 MED ORDER — MIDAZOLAM HCL 2 MG/2ML IJ SOLN
INTRAMUSCULAR | Status: AC
  Filled 2021-06-12: qty 2

## 2021-06-12 MED ORDER — TRAMADOL HCL 50 MG PO TABS
50.0000 mg | ORAL_TABLET | Freq: Four times a day (QID) | ORAL | Status: DC
  Administered 2021-06-12 – 2021-06-13 (×4): 50 mg via ORAL
  Filled 2021-06-12 (×4): qty 1

## 2021-06-12 MED ORDER — CEFAZOLIN SODIUM-DEXTROSE 2-4 GM/100ML-% IV SOLN
INTRAVENOUS | Status: AC
  Filled 2021-06-12: qty 100

## 2021-06-12 MED ORDER — PHENYLEPHRINE HCL-NACL 20-0.9 MG/250ML-% IV SOLN
INTRAVENOUS | Status: DC | PRN
  Administered 2021-06-12: 25 ug/min via INTRAVENOUS

## 2021-06-12 MED ORDER — OXYCODONE HCL 5 MG PO TABS: 5.0000 mg | ORAL_TABLET | Freq: Once | ORAL | Status: DC | PRN

## 2021-06-12 MED ORDER — CLINDAMYCIN PHOSPHATE 600 MG/50ML IV SOLN
600.0000 mg | Freq: Four times a day (QID) | INTRAVENOUS | Status: AC
  Administered 2021-06-12 – 2021-06-13 (×2): 600 mg via INTRAVENOUS
  Filled 2021-06-12 (×2): qty 50

## 2021-06-12 MED ORDER — FENTANYL CITRATE (PF) 100 MCG/2ML IJ SOLN: 25.0000 ug | INTRAMUSCULAR | Status: DC | PRN

## 2021-06-12 MED ORDER — GLIPIZIDE 5 MG PO TABS
10.0000 mg | ORAL_TABLET | Freq: Every day | ORAL | Status: DC
  Administered 2021-06-13: 10 mg via ORAL
  Filled 2021-06-12: qty 2

## 2021-06-12 MED ORDER — DOCUSATE SODIUM 100 MG PO CAPS
100.0000 mg | ORAL_CAPSULE | Freq: Two times a day (BID) | ORAL | Status: DC
  Administered 2021-06-13: 100 mg via ORAL
  Filled 2021-06-12 (×2): qty 1

## 2021-06-12 MED ORDER — NEOMYCIN-POLYMYXIN B GU 40-200000 IR SOLN
Status: DC | PRN
  Administered 2021-06-12: 4 mL

## 2021-06-12 MED ORDER — VITAMIN D (ERGOCALCIFEROL) 1.25 MG (50000 UNIT) PO CAPS
50000.0000 [IU] | ORAL_CAPSULE | ORAL | Status: DC
  Administered 2021-06-13: 50000 [IU] via ORAL
  Filled 2021-06-12: qty 1

## 2021-06-12 MED ORDER — SUCCINYLCHOLINE CHLORIDE 200 MG/10ML IV SOSY
PREFILLED_SYRINGE | INTRAVENOUS | Status: DC | PRN
Start: 2021-06-12 — End: 2021-06-12
  Administered 2021-06-12: 100 mg via INTRAVENOUS

## 2021-06-12 MED ORDER — ONDANSETRON HCL 4 MG/2ML IJ SOLN
INTRAMUSCULAR | Status: DC | PRN
  Administered 2021-06-12: 4 mg via INTRAVENOUS

## 2021-06-12 MED ORDER — ESMOLOL HCL 100 MG/10ML IV SOLN
INTRAVENOUS | Status: DC | PRN
  Administered 2021-06-12 (×3): 10 mg via INTRAVENOUS

## 2021-06-12 MED ORDER — MENTHOL 3 MG MT LOZG
1.0000 | LOZENGE | OROMUCOSAL | Status: DC | PRN
  Filled 2021-06-12: qty 9

## 2021-06-12 MED ORDER — ACETAMINOPHEN 500 MG PO TABS
1000.0000 mg | ORAL_TABLET | Freq: Four times a day (QID) | ORAL | Status: AC
  Administered 2021-06-12 – 2021-06-13 (×4): 1000 mg via ORAL
  Filled 2021-06-12 (×4): qty 2

## 2021-06-12 MED ORDER — LOSARTAN POTASSIUM 50 MG PO TABS
50.0000 mg | ORAL_TABLET | Freq: Every day | ORAL | Status: DC
  Administered 2021-06-12: 50 mg via ORAL
  Filled 2021-06-12 (×2): qty 1

## 2021-06-12 MED ORDER — MIDAZOLAM HCL 2 MG/2ML IJ SOLN
INTRAMUSCULAR | Status: DC | PRN
  Administered 2021-06-12: 1 mg via INTRAVENOUS

## 2021-06-12 MED ORDER — LIDOCAINE HCL (PF) 2 % IJ SOLN
INTRAMUSCULAR | Status: AC
  Filled 2021-06-12: qty 5

## 2021-06-12 MED ORDER — DEXAMETHASONE SODIUM PHOSPHATE 10 MG/ML IJ SOLN
INTRAMUSCULAR | Status: AC
  Filled 2021-06-12: qty 1

## 2021-06-12 MED ORDER — OXYCODONE HCL 5 MG/5ML PO SOLN: 5.0000 mg | Freq: Once | ORAL | Status: DC | PRN

## 2021-06-12 MED ORDER — PANTOPRAZOLE SODIUM 40 MG PO TBEC
40.0000 mg | DELAYED_RELEASE_TABLET | Freq: Every day | ORAL | Status: DC
  Administered 2021-06-12 – 2021-06-13 (×2): 40 mg via ORAL
  Filled 2021-06-12 (×2): qty 1

## 2021-06-12 MED ORDER — CLINDAMYCIN PHOSPHATE 600 MG/50ML IV SOLN
600.0000 mg | Freq: Once | INTRAVENOUS | Status: AC
  Administered 2021-06-12: 600 mg via INTRAVENOUS

## 2021-06-12 MED ORDER — EZETIMIBE 10 MG PO TABS
10.0000 mg | ORAL_TABLET | Freq: Every day | ORAL | Status: DC
  Administered 2021-06-12 – 2021-06-13 (×2): 10 mg via ORAL
  Filled 2021-06-12 (×2): qty 1

## 2021-06-12 MED ORDER — OXYCODONE HCL 5 MG PO TABS
10.0000 mg | ORAL_TABLET | ORAL | Status: DC | PRN
  Administered 2021-06-13: 10 mg via ORAL

## 2021-06-12 MED ORDER — MYCOPHENOLATE MOFETIL 250 MG PO CAPS
1000.0000 mg | ORAL_CAPSULE | Freq: Two times a day (BID) | ORAL | Status: DC
  Administered 2021-06-12 – 2021-06-13 (×2): 1000 mg via ORAL
  Filled 2021-06-12 (×4): qty 4

## 2021-06-12 MED ORDER — DEXTROSE 50 % IV SOLN: 12.5000 g | Freq: Once | INTRAVENOUS | Status: AC

## 2021-06-12 MED ORDER — LIDOCAINE HCL (CARDIAC) PF 100 MG/5ML IV SOSY
PREFILLED_SYRINGE | INTRAVENOUS | Status: DC | PRN
  Administered 2021-06-12: 100 mg via INTRAVENOUS

## 2021-06-12 MED ORDER — METOPROLOL TARTRATE 5 MG/5ML IV SOLN
INTRAVENOUS | Status: DC | PRN
  Administered 2021-06-12: 2 mg via INTRAVENOUS

## 2021-06-12 MED ORDER — SUGAMMADEX SODIUM 500 MG/5ML IV SOLN
INTRAVENOUS | Status: DC | PRN
  Administered 2021-06-12: 200 mg via INTRAVENOUS

## 2021-06-12 MED ORDER — CLOPIDOGREL BISULFATE 75 MG PO TABS
75.0000 mg | ORAL_TABLET | Freq: Every day | ORAL | Status: DC
  Administered 2021-06-13: 75 mg via ORAL
  Filled 2021-06-12: qty 1

## 2021-06-12 MED ORDER — ONDANSETRON HCL 4 MG PO TABS: 4.0000 mg | ORAL_TABLET | Freq: Four times a day (QID) | ORAL | Status: DC | PRN

## 2021-06-12 MED ORDER — VASOPRESSIN 20 UNIT/ML IV SOLN
INTRAVENOUS | Status: DC | PRN
  Administered 2021-06-12 (×2): 2 [IU] via INTRAVENOUS

## 2021-06-12 MED ORDER — CARVEDILOL 25 MG PO TABS
25.0000 mg | ORAL_TABLET | Freq: Two times a day (BID) | ORAL | Status: DC
  Administered 2021-06-13: 25 mg via ORAL
  Filled 2021-06-12: qty 1

## 2021-06-12 MED ORDER — FENTANYL CITRATE (PF) 100 MCG/2ML IJ SOLN
INTRAMUSCULAR | Status: DC | PRN
  Administered 2021-06-12 (×4): 50 ug via INTRAVENOUS

## 2021-06-12 MED ORDER — ATORVASTATIN CALCIUM 20 MG PO TABS
80.0000 mg | ORAL_TABLET | Freq: Every day | ORAL | Status: DC
  Administered 2021-06-12 – 2021-06-13 (×2): 80 mg via ORAL
  Filled 2021-06-12 (×2): qty 4

## 2021-06-12 MED ORDER — ROCURONIUM BROMIDE 10 MG/ML (PF) SYRINGE
PREFILLED_SYRINGE | INTRAVENOUS | Status: AC
  Filled 2021-06-12: qty 10

## 2021-06-12 MED ORDER — ONDANSETRON HCL 4 MG/2ML IJ SOLN
INTRAMUSCULAR | Status: AC
  Filled 2021-06-12: qty 2

## 2021-06-12 MED ORDER — BISACODYL 10 MG RE SUPP: 10.0000 mg | Freq: Every day | RECTAL | Status: DC | PRN

## 2021-06-12 MED ORDER — METHOCARBAMOL 1000 MG/10ML IJ SOLN
500.0000 mg | Freq: Four times a day (QID) | INTRAVENOUS | Status: DC | PRN
  Filled 2021-06-12: qty 5

## 2021-06-12 MED ORDER — HYDROMORPHONE HCL 1 MG/ML IJ SOLN: 0.5000 mg | INTRAMUSCULAR | Status: DC | PRN

## 2021-06-12 MED ORDER — ACETAMINOPHEN 10 MG/ML IV SOLN
INTRAVENOUS | Status: AC
  Filled 2021-06-12: qty 100

## 2021-06-12 SURGICAL SUPPLY — 72 items
BIT DRILL 2.5X110 QC LCP DISP (BIT) ×2 IMPLANT
BIT DRILL 3.8X6 NS (BIT) ×2 IMPLANT
BIT DRILL 4.4 NS (BIT) ×2 IMPLANT
BIT DRILL CAL 3.2 LONG (BIT) IMPLANT
BNDG ELASTIC 4X5.8 VLCR NS LF (GAUZE/BANDAGES/DRESSINGS) ×2 IMPLANT
BNDG ELASTIC 6X5.8 VLCR NS LF (GAUZE/BANDAGES/DRESSINGS) ×2 IMPLANT
CUFF TOURN SGL QUICK 24 (TOURNIQUET CUFF)
CUFF TOURN SGL QUICK 34 (TOURNIQUET CUFF)
CUFF TRNQT CYL 24X4X16.5-23 (TOURNIQUET CUFF) IMPLANT
CUFF TRNQT CYL 34X4.125X (TOURNIQUET CUFF) IMPLANT
DRAPE 3/4 80X56 (DRAPES) ×4 IMPLANT
DRAPE C-ARM XRAY 36X54 (DRAPES) ×2 IMPLANT
DRAPE C-ARMOR (DRAPES) ×2 IMPLANT
DURAPREP 26ML APPLICATOR (WOUND CARE) ×4 IMPLANT
ELECT REM PT RETURN 9FT ADLT (ELECTROSURGICAL) ×2
ELECTRODE REM PT RTRN 9FT ADLT (ELECTROSURGICAL) ×1 IMPLANT
GAUZE 4X4 16PLY ~~LOC~~+RFID DBL (SPONGE) IMPLANT
GAUZE SPONGE 4X4 12PLY STRL (GAUZE/BANDAGES/DRESSINGS) ×2 IMPLANT
GAUZE XEROFORM 1X8 LF (GAUZE/BANDAGES/DRESSINGS) ×2 IMPLANT
GAUZE XEROFORM 4X4 STRL (GAUZE/BANDAGES/DRESSINGS) ×2 IMPLANT
GLOVE SURG ORTHO LTX SZ9 (GLOVE) ×4 IMPLANT
GLOVE SURG UNDER POLY LF SZ9 (GLOVE) ×2 IMPLANT
GOWN STRL REUS TWL 2XL XL LVL4 (GOWN DISPOSABLE) ×2 IMPLANT
GOWN STRL REUS W/ TWL LRG LVL3 (GOWN DISPOSABLE) ×1 IMPLANT
GOWN STRL REUS W/TWL LRG LVL3 (GOWN DISPOSABLE) ×1
GUIDEWIRE BALL NOSE 80CM (WIRE) ×2 IMPLANT
HEMOVAC 400CC 10FR (MISCELLANEOUS) IMPLANT
KIT TURNOVER KIT A (KITS) ×2 IMPLANT
MANIFOLD NEPTUNE II (INSTRUMENTS) ×2 IMPLANT
MAT ABSORB  FLUID 56X50 GRAY (MISCELLANEOUS) ×1
MAT ABSORB FLUID 56X50 GRAY (MISCELLANEOUS) ×1 IMPLANT
NAIL TIBIAL 10MMX37.5CM (Nail) ×2 IMPLANT
NEEDLE FILTER BLUNT 18X 1/2SAF (NEEDLE) ×1
NEEDLE FILTER BLUNT 18X1 1/2 (NEEDLE) ×1 IMPLANT
NS IRRIG 1000ML POUR BTL (IV SOLUTION) ×2 IMPLANT
PACK TOTAL KNEE (MISCELLANEOUS) ×2 IMPLANT
PAD ABD DERMACEA PRESS 5X9 (GAUZE/BANDAGES/DRESSINGS) ×12 IMPLANT
PAD CAST CTTN 4X4 STRL (SOFTGOODS) ×1 IMPLANT
PADDING CAST 6X4YD NS (MISCELLANEOUS)
PADDING CAST BLEND 4X4 NS (MISCELLANEOUS) IMPLANT
PADDING CAST COTTON 4X4 STRL (SOFTGOODS) ×1
PADDING CAST COTTON 6X4 NS (MISCELLANEOUS) IMPLANT
PIN GUIDE ACE (PIN) ×2 IMPLANT
PLATE LCP 3.5 1/3 TUB 7HX81 (Plate) ×2 IMPLANT
REAMER ROD DEEP FLUTE 2.5X950 (INSTRUMENTS) IMPLANT
SCREW ACECAP 42MM (Screw) ×2 IMPLANT
SCREW ACECAP 48MM (Screw) ×2 IMPLANT
SCREW CORTEX 3.5 14MM (Screw) ×3 IMPLANT
SCREW CORTEX 3.5 16MM (Screw) ×3 IMPLANT
SCREW CORTICAL 5.5 35MM (Screw) ×2 IMPLANT
SCREW LOCK CORT ST 3.5X14 (Screw) ×3 IMPLANT
SCREW LOCK CORT ST 3.5X16 (Screw) ×3 IMPLANT
SCREW PROXIMAL DEPUY (Screw) ×2 IMPLANT
SCREW PRXML FT 50X5.5XLCK NS (Screw) ×1 IMPLANT
SCREW PRXML FT 55X5.5XNS TIB (Screw) ×1 IMPLANT
SLEEVE PROTECTION STRL DISP (MISCELLANEOUS) ×2 IMPLANT
SPLINT CAST 1 STEP 4X30 (MISCELLANEOUS) ×4 IMPLANT
SPLINT CAST 1 STEP 5X30 WHT (MISCELLANEOUS) IMPLANT
SPONGE T-LAP 18X18 ~~LOC~~+RFID (SPONGE) ×4 IMPLANT
STAPLER SKIN PROX 35W (STAPLE) ×2 IMPLANT
STRAP ANKLE FOOT DISTRACTOR (ORTHOPEDIC SUPPLIES) ×2 IMPLANT
SUT ETHILON 4 0 PS 2 18 (SUTURE) ×2 IMPLANT
SUT ETHILON 4-0 (SUTURE) ×1
SUT ETHILON 4-0 FS2 18XMFL BLK (SUTURE) ×1
SUT MNCRL AB 3-0 PS2 27 (SUTURE) ×2 IMPLANT
SUT VIC AB 0 CT2 27 (SUTURE) ×2 IMPLANT
SUT VIC AB 2-0 CT2 27 (SUTURE) ×2 IMPLANT
SUT VIC AB 2-0 SH 27 (SUTURE) ×1
SUT VIC AB 2-0 SH 27XBRD (SUTURE) ×1 IMPLANT
SUTURE ETHLN 4-0 FS2 18XMF BLK (SUTURE) ×1 IMPLANT
SYR 10ML LL (SYRINGE) ×2 IMPLANT
WATER STERILE IRR 500ML POUR (IV SOLUTION) ×2 IMPLANT

## 2021-06-12 NOTE — Assessment & Plan Note (Signed)
Ortho planning surgery today.  Pain management and DVT prophylaxis per Ortho team

## 2021-06-12 NOTE — Assessment & Plan Note (Signed)
Sliding scale for now

## 2021-06-12 NOTE — Assessment & Plan Note (Signed)
Continue immunosuppressant -CellCept, prednisone, tacrolimus

## 2021-06-12 NOTE — Assessment & Plan Note (Signed)
Continue as needed labetalol for now

## 2021-06-12 NOTE — Progress Notes (Signed)
  Progress Note    James Riggs   MWU:[id number]  DOB: 23-Sep-1965  DOA: 06/11/2021     1 Date of Service: 06/12/2021   Clinical Course  55 year old male with diabetes, MI, hypertension, hyperlipidemia, ESRD s/p renal transplant on immunosuppressive therapy is admitted for displaced left distal tibia/fibular shaft fracture  12/1: Ortho planning surgery today   Assessment and Plan * Tibia/fibula fracture, left, closed, initial encounter Ortho planning surgery today.  Pain management and DVT prophylaxis per Ortho team  Essential hypertension Continue as needed labetalol for now  Renal transplant, status post Continue immunosuppressant -CellCept, prednisone, tacrolimus  Type 2 diabetes mellitus with hyperlipidemia ([facility]CC) Sliding scale for now     Subjective:  No new complaints.  Pain manageable with medications.  Waiting for surgery  Objective Vitals:   06/11/21 2355 06/12/21 0456 06/12/21 0742 06/12/21 1138  BP: 104/75 115/78 130/87 119/79  Pulse: 89 88 83 87  Resp: 17 18 12 14   Temp: 98.8 F (37.1 C) 98.3 F (36.8 C) 98.1 F (36.7 C) 98 F (36.7 C)  TempSrc:      SpO2: 94% 97% 99% 98%  Weight:      Height:       81.6 kg  Vital signs were reviewed and unremarkable.   Exam Physical Exam   GENERAL:  55 y.o.-year-old African-American male patient lying in the bed with no acute distress.  EYES: Pupils equal, round, reactive to light and accommodation. No scleral icterus. Extraocular muscles intact.  HEENT: Head atraumatic, normocephalic. Oropharynx and nasopharynx clear.  NECK:  Supple, no jugular venous distention. No thyroid enlargement, no tenderness.  LUNGS: Normal breath sounds bilaterally, no wheezing, rales,rhonchi or crepitation. No use of accessory muscles of respiration.  CARDIOVASCULAR: Regular rate and rhythm, S1, S2 normal. No murmurs, rubs, or gallops.  ABDOMEN: Soft, benign Musculoskeletal: Left lower extremity was splinted. NEUROLOGIC: Alert  and oriented, nonfocal PSYCHIATRIC: Normal mood and affect SKIN: No obvious rash, lesion, or ulcer.   Labs / Other Information My review of labs, imaging, notes and other tests shows no new significant findings.    Disposition Plan: Status is: Inpatient  Remains inpatient appropriate because: Waiting for orthopedic surgery today        Time spent: 15 minutes [facility] 06/12/2021, 2:34 PM

## 2021-06-12 NOTE — Hospital Course (Signed)
55 year old male with diabetes, MI, hypertension, hyperlipidemia, ESRD s/p renal transplant on immunosuppressive therapy is admitted for displaced left distal tibia/fibular shaft fracture  12/1: Ortho planning surgery today

## 2021-06-12 NOTE — Transfer of Care (Signed)
Immediate Anesthesia Transfer of Care Note  Patient: James Riggs  Procedure(s) Performed: INTRAMEDULLARY (IM) NAIL TIBIAL (Left)  Patient Location: PACU  Anesthesia Type:General  Level of Consciousness: awake  Airway & Oxygen Therapy: Patient Spontanous Breathing and Patient connected to face mask oxygen  Post-op Assessment: Report given to RN and Post -op Vital signs reviewed and stable  Post vital signs: Reviewed  Last Vitals:  Vitals Value Taken Time  BP 126/90 06/12/21 1950  Temp    Pulse    Resp 17 06/12/21 1953  SpO2    Vitals shown include unvalidated device data.  Last Pain:  Vitals:   06/12/21 1513  TempSrc:   PainSc: 4          Complications: No notable events documented.

## 2021-06-12 NOTE — Anesthesia Preprocedure Evaluation (Signed)
Anesthesia Evaluation  Patient identified by MRN, date of birth, ID band Patient awake    Reviewed: Allergy & Precautions, NPO status , Patient's Chart, lab work & pertinent test results  History of Anesthesia Complications Negative for: history of anesthetic complications  Airway Mallampati: III  TM Distance: >3 FB Neck ROM: full    Dental  (+) Chipped, Poor Dentition   Pulmonary neg pulmonary ROS, neg shortness of breath,    Pulmonary exam normal        Cardiovascular Exercise Tolerance: Good hypertension, (-) angina+ Past MI and + Cardiac Stents  Normal cardiovascular exam     Neuro/Psych negative neurological ROS  negative psych ROS   GI/Hepatic negative GI ROS, Neg liver ROS, neg GERD  ,  Endo/Other  negative endocrine ROSdiabetes, Type 2, Insulin Dependent  Renal/GU Renal disease     Musculoskeletal   Abdominal   Peds  Hematology negative hematology ROS (+)   Anesthesia Other Findings Past Medical History: No date: Diabetes mellitus without complication (HCC) No date: Hypercholesteremia No date: Hypertension  Past Surgical History: No date: CARDIAC SURGERY No date: NEPHRECTOMY TRANSPLANTED ORGAN No date: TOTAL HIP ARTHROPLASTY; Bilateral  BMI    Body Mass Index: 26.58 kg/m      Reproductive/Obstetrics negative OB ROS                             Anesthesia Physical Anesthesia Plan  ASA: 3  Anesthesia Plan: General ETT   Post-op Pain Management:    Induction: Intravenous  PONV Risk Score and Plan: Ondansetron, Dexamethasone, Midazolam and Treatment may vary due to age or medical condition  Airway Management Planned: Oral ETT  Additional Equipment:   Intra-op Plan:   Post-operative Plan: Extubation in OR  Informed Consent: I have reviewed the patients History and Physical, chart, labs and discussed the procedure including the risks, benefits and alternatives  for the proposed anesthesia with the patient or authorized representative who has indicated his/her understanding and acceptance.     Dental Advisory Given  Plan Discussed with: Anesthesiologist, CRNA and Surgeon  Anesthesia Plan Comments: (Patient does not meet anit coagualtion guidelines for neuraxial so plan for GA  Patient consented for risks of anesthesia including but not limited to:  - adverse reactions to medications - damage to eyes, teeth, lips or other oral mucosa - nerve damage due to positioning  - sore throat or hoarseness - Damage to heart, brain, nerves, lungs, other parts of body or loss of life  Patient voiced understanding.)        Anesthesia Quick Evaluation
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
## 2021-06-12 NOTE — Anesthesia Procedure Notes (Signed)
Procedure Name: Intubation Date/Time: 06/12/2021 4:44 PM Performed by: Kelton Pillar, CRNA Pre-anesthesia Checklist: Patient identified, Emergency Drugs available, Suction available and Patient being monitored Patient Re-evaluated:Patient Re-evaluated prior to induction Oxygen Delivery Method: Circle system utilized Preoxygenation: Pre-oxygenation with 100% oxygen Induction Type: IV induction Ventilation: Mask ventilation without difficulty Laryngoscope Size: McGraph and 3 Grade View: Grade I Tube type: Oral Tube size: 7.5 mm Number of attempts: 1 Airway Equipment and Method: Stylet and Oral airway Placement Confirmation: ETT inserted through vocal cords under direct vision, positive ETCO2, breath sounds checked- equal and bilateral and CO2 detector Secured at: 21 cm Tube secured with: Tape Dental Injury: Teeth and Oropharynx as per pre-operative assessment

## 2021-06-13 ENCOUNTER — Encounter: Payer: Self-pay | Admitting: Orthopedic Surgery

## 2021-06-13 DIAGNOSIS — Z419 Encounter for procedure for purposes other than remedying health state, unspecified: Secondary | ICD-10-CM

## 2021-06-13 DIAGNOSIS — S82202B Unspecified fracture of shaft of left tibia, initial encounter for open fracture type I or II: Secondary | ICD-10-CM

## 2021-06-13 LAB — CBC
HCT: 38.4 % — ABNORMAL LOW (ref 39.0–52.0)
Hemoglobin: 12.5 g/dL — ABNORMAL LOW (ref 13.0–17.0)
MCH: 28.8 pg (ref 26.0–34.0)
MCHC: 32.6 g/dL (ref 30.0–36.0)
MCV: 88.5 fL (ref 80.0–100.0)
Platelets: 166 10*3/uL (ref 150–400)
RBC: 4.34 MIL/uL (ref 4.22–5.81)
RDW: 12.7 % (ref 11.5–15.5)
WBC: 10.6 10*3/uL — ABNORMAL HIGH (ref 4.0–10.5)
nRBC: 0 % (ref 0.0–0.2)

## 2021-06-13 LAB — HEMOGLOBIN A1C
Hgb A1c MFr Bld: 7.4 % — ABNORMAL HIGH (ref 4.8–5.6)
Mean Plasma Glucose: 166 mg/dL

## 2021-06-13 LAB — BASIC METABOLIC PANEL
Anion gap: 7 (ref 5–15)
BUN: 18 mg/dL (ref 6–20)
CO2: 18 mmol/L — ABNORMAL LOW (ref 22–32)
Calcium: 9.2 mg/dL (ref 8.9–10.3)
Chloride: 112 mmol/L — ABNORMAL HIGH (ref 98–111)
Creatinine, Ser: 1.28 mg/dL — ABNORMAL HIGH (ref 0.61–1.24)
GFR, Estimated: >60 mL/min (ref >60)
Glucose, Bld: 245 mg/dL — ABNORMAL HIGH (ref 70–99)
Potassium: 4.3 mmol/L (ref 3.5–5.1)
Sodium: 137 mmol/L (ref 135–145)

## 2021-06-13 LAB — GLUCOSE, CAPILLARY
Glucose-Capillary: 146 mg/dL — ABNORMAL HIGH (ref 70–99)
Glucose-Capillary: 217 mg/dL — ABNORMAL HIGH (ref 70–99)
Glucose-Capillary: 282 mg/dL — ABNORMAL HIGH (ref 70–99)

## 2021-06-13 MED ORDER — OXYCODONE HCL 5 MG PO TABS: 5.0000 mg | ORAL_TABLET | Freq: Three times a day (TID) | ORAL | 0 refills | Status: AC | PRN

## 2021-06-13 MED ORDER — METHOCARBAMOL 500 MG PO TABS: 500.0000 mg | ORAL_TABLET | Freq: Three times a day (TID) | ORAL | 0 refills | Status: AC | PRN

## 2021-06-13 MED ORDER — TRAMADOL HCL 50 MG PO TABS: 50.0000 mg | ORAL_TABLET | Freq: Four times a day (QID) | ORAL | 0 refills | Status: AC

## 2021-06-13 MED ORDER — CIPROFLOXACIN HCL 500 MG PO TABS: 500.0000 mg | ORAL_TABLET | Freq: Two times a day (BID) | ORAL | 0 refills | Status: AC

## 2021-06-13 NOTE — Progress Notes (Signed)
Discharge Note:  Staff Nurse Reviewed discharge instructions with pt. Staff nurse removed IV cath. Pt discharged to home with BCS, personal belongings.  Staff wheeled pt out. Pt transported to home via family private vehicle.

## 2021-06-13 NOTE — Progress Notes (Signed)
Subjective:  POD #1 s/p intramedullary fixation for left tibial fracture and ORIF of left fibula fracture.   Patient reports left knee, leg and ankle pain as mild.  Patient states he was able to get up and walk with therapy today.  Objective:   VITALS:   Vitals:   06/13/21 0116 06/13/21 0450 06/13/21 0805 06/13/21 1202  BP: 104/71 105/77 101/76 (!) 92/59  Pulse: (!) 109 98 (!) 101 91  Resp: 18 18 18 18   Temp: 98.7 F (37.1 C) 97.7 F (36.5 C) 98.2 F (36.8 C) 97.7 F (36.5 C)  TempSrc: Oral Oral Oral   SpO2: 99% 98% 99% 99%  Weight:      Height:        PHYSICAL EXAM: Left lower extremity Neurovascular intact Sensation intact distally Intact pulses distally Dorsiflexion/Plantar flexion intact Incision: dressing C/D/I No cellulitis present Compartment soft  LABS  Results for orders placed or performed during the hospital encounter of 06/11/21 (from the past 24 hour(s))  Glucose, capillary     Status: None   Collection Time: 06/12/21  3:15 PM  Result Value Ref Range   Glucose-Capillary 78 70 - 99 mg/dL  Glucose, capillary     Status: Abnormal   Collection Time: 06/12/21  4:03 PM  Result Value Ref Range   Glucose-Capillary 121 (H) 70 - 99 mg/dL  Glucose, capillary     Status: None   Collection Time: 06/12/21  7:55 PM  Result Value Ref Range   Glucose-Capillary 94 70 - 99 mg/dL  Glucose, capillary     Status: Abnormal   Collection Time: 06/12/21  9:54 PM  Result Value Ref Range   Glucose-Capillary 119 (H) 70 - 99 mg/dL   Comment 1 Notify RN   CBC     Status: Abnormal   Collection Time: 06/13/21  2:14 AM  Result Value Ref Range   WBC 10.6 (H) 4.0 - 10.5 K/uL   RBC 4.34 4.22 - 5.81 MIL/uL   Hemoglobin 12.5 (L) 13.0 - 17.0 g/dL   HCT 38.4 (L) 39.0 - 52.0 %   MCV 88.5 80.0 - 100.0 fL   MCH 28.8 26.0 - 34.0 pg   MCHC 32.6 30.0 - 36.0 g/dL   RDW 12.7 11.5 - 15.5 %   Platelets 166 150 - 400 K/uL   nRBC 0.0 0.0 - 0.2 %  Basic metabolic panel     Status: Abnormal    Collection Time: 06/13/21  2:14 AM  Result Value Ref Range   Sodium 137 135 - 145 mmol/L   Potassium 4.3 3.5 - 5.1 mmol/L   Chloride 112 (H) 98 - 111 mmol/L   CO2 18 (L) 22 - 32 mmol/L   Glucose, Bld 245 (H) 70 - 99 mg/dL   BUN 18 6 - 20 mg/dL   Creatinine, Ser 1.28 (H) 0.61 - 1.24 mg/dL   Calcium 9.2 8.9 - 10.3 mg/dL   GFR, Estimated >60 >60 mL/min   Anion gap 7 5 - 15  Glucose, capillary     Status: Abnormal   Collection Time: 06/13/21  8:06 AM  Result Value Ref Range   Glucose-Capillary 146 (H) 70 - 99 mg/dL  Glucose, capillary     Status: Abnormal   Collection Time: 06/13/21 12:03 PM  Result Value Ref Range   Glucose-Capillary 217 (H) 70 - 99 mg/dL    DG Tibia/Fibula Left  Result Date: 06/12/2021 CLINICAL DATA:  OPERATIVE IMAGING FROM ORIF A LEFT TIBIAL FRACTURE. EXAM: OPERATIVE LEFT TIBIA AND  FIBULA 14 VIEWS; DG C-ARM 1-60 MIN-NO REPORT COMPARISON:  06/11/2021 FLUOROSCOPY TIME:  Fluoroscopy Time:  1.41 seconds Radiation Exposure Index (if provided by the fluoroscopic device): 5.8 mGy Number of Acquired Spot Images: 14 FINDINGS: Multiple spot fluoro graphic images show placement an intramedullary rod and associated fixation screws reducing the distal tibia fracture into near anatomic alignment. There is also placement of a lateral fixation plate and screws reducing the distal fibular fracture into near anatomic alignment. Orthopedic hardware appears well seated. IMPRESSION: 1. Well-aligned distal tibia and fibular fractures following ORIF. Electronically Signed   By: Lajean Manes M.D.   On: 06/12/2021 19:44   DG Tibia/Fibula Left  Result Date: 06/11/2021 CLINICAL DATA:  Status post reduction EXAM: LEFT TIBIA AND FIBULA - 2 VIEW COMPARISON:  None. FINDINGS: Improved angulation of distal tibia and fibula fractures. There is unchanged lateral displacement of both fractures. There is mild persistent anterior angulation. IMPRESSION: Improved angulation of distal tibia and fibula  fractures. Unchanged lateral displacement. Electronically Signed   By: Ulyses Jarred M.D.   On: 06/11/2021 19:41   DG Tibia/Fibula Left  Result Date: 06/11/2021 CLINICAL DATA:  Fracture of the left lower extremity EXAM: LEFT TIBIA AND FIBULA - 2 VIEW COMPARISON:  None. FINDINGS: Displaced and angulated fractures of the distal tibial and fibular diaphysis. There is lateral angulation and displacement of the distal fracture fragments as well as anterior displacement. No dislocation. The bones are well mineralized. Sclerotic changes of the lateral femoral condyle may be chronic or represent bone contusion versus less likely infarct. Mild soft tissue swelling of the distal leg. No opaque foreign object or soft tissue gas. IMPRESSION: Displaced and angulated fractures of the distal tibial and fibular diaphysis. No dislocation. Electronically Signed   By: Anner Crete M.D.   On: 06/11/2021 17:25   DG Tibia/Fibula Left Port  Result Date: 06/12/2021 CLINICAL DATA:  Follow-up examination status post ORIF. EXAM: PORTABLE LEFT TIBIA AND FIBULA - 2 VIEW COMPARISON:  Prior radiograph from 06/11/2021. FINDINGS: Splinting material overlies the lower left leg. Postoperative changes from interval ORIF at the distal tibia and fibula. IM nail with proximal and distal interlocking screws in place within the tibia. Lateral plate screw fixation in place at the distal fibula. Hardware appears well positioned and intact. Distal tibial/fibular fractures in gross anatomic alignment status post fixation. Overlying soft tissue swelling and skin staples with scattered postoperative emphysema. No adverse features. IMPRESSION: Postoperative changes from interval ORIF at the distal left tibia and fibula without adverse features. Electronically Signed   By: Jeannine Boga M.D.   On: 06/12/2021 20:32   DG C-Arm 1-60 Min-No Report  Result Date: 06/12/2021 Fluoroscopy was utilized by the requesting physician.  No radiographic  interpretation.    Assessment/Plan: 1 Day Post-Op   Principal Problem:   Tibia/fibula fracture, left, closed, initial encounter Active Problems:   Type 2 diabetes mellitus with hyperlipidemia ([place])   Renal transplant, status post   Essential hypertension   Type I or II open fracture of left tibia and fibula   Surgery, elective  Patient will be discharged home today if he clears physical therapy.  He is doing well from orthopedic standpoint.  He will be nonweightbearing on the left lower extremity until he follows up in our office in 10 to 14 days.  He should take his aspirin and Plavix as previously prescribed for DVT prophylaxis until his return.  Patient will continue to elevate his left lower extremity for the next 7 days.  I discussed this case with Dr. Brigitte Pulse.  The patient has skin abrasions and a small open wound laterally over his left ankle which would benefit from continued antibiotic treatment.  Patient has numerous antibiotic allergies and therefore will be prescribed Cipro 500 mg p.o. twice daily.    Thornton Park , MD 06/13/2021, 2:12 PM
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
## 2021-06-15 NOTE — Anesthesia Postprocedure Evaluation (Signed)
Anesthesia Post Note  Patient: [name]  Procedure(s) Performed: INTRAMEDULLARY (IM) NAIL TIBIAL (Left)  Patient location during evaluation: PACU Anesthesia Type: General Level of consciousness: awake and alert Pain management: pain level controlled Vital Signs Assessment: post-procedure vital signs reviewed and stable Respiratory status: spontaneous breathing, nonlabored ventilation, respiratory function stable and patient connected to nasal cannula oxygen Cardiovascular status: blood pressure returned to baseline and stable Postop Assessment: no apparent nausea or vomiting Anesthetic complications: no   No notable events documented.   Last Vitals:  Vitals:   06/13/21 1202 06/13/21 1644  BP: (!) 92/59 102/66  Pulse: 91 93  Resp: 18 18  Temp: 36.5 C 36.6 C  SpO2: 99% 97%    Last Pain:  Vitals:   06/13/21 1644  TempSrc: Oral  PainSc:                  Molli Barrows

## 2021-06-15 NOTE — Discharge Summary (Signed)
Physician Discharge Summary   Patient name: James Riggs  Admit date:     06/11/2021  Discharge date: 06/13/2021  Discharge Physician: Max Sane   PCP: Noralee Stain, MD   Recommendations at discharge: f/up with outpt providers as requested  Discharge Diagnoses Principal Problem:   Tibia/fibula fracture, left, closed, initial encounter Active Problems:   Type 2 diabetes mellitus with hyperlipidemia [facility])   Renal transplant, status post   Essential hypertension   Type I or II open fracture of left tibia and fibula   Surgery, elective  Hospital Course   55 year old male with diabetes, MI, hypertension, hyperlipidemia, ESRD s/p renal transplant on immunosuppressive therapy is admitted for displaced left distal tibia/fibular shaft fracture  12/1: Ortho planning surgery today  Tibia/fibula fracture, left, closed, initial encounter  s/p intramedullary fixation for left tibial fracture and ORIF of left fibula fracture.    Essential hypertension Well controlled   Renal transplant, status post Continue home regimen of immunosuppresants   Type 2 diabetes mellitus with hyperlipidemia (HCC) Sliding scale while in the hospital     Procedures performed:  s/p intramedullary fixation for left tibial fracture and ORIF of left fibula fracture   Condition at discharge: good  Exam Physical Exam   GENERAL:  55 y.o.-year-old African-American male patient lying in the bed with no acute distress.  EYES: Pupils equal, round, reactive to light and accommodation. No scleral icterus. Extraocular muscles intact.  HEENT: Head atraumatic, normocephalic. Oropharynx and nasopharynx clear.  NECK:  Supple, no jugular venous distention. No thyroid enlargement, no tenderness.  LUNGS: Normal breath sounds bilaterally, no wheezing, rales,rhonchi or crepitation. No use of accessory muscles of respiration.  CARDIOVASCULAR: Regular rate and rhythm, S1, S2 normal. No murmurs, rubs, or  gallops.  ABDOMEN: Soft, benign Musculoskeletal: dressing C/D/I on LLE NEUROLOGIC: Alert and oriented, nonfocal PSYCHIATRIC: Normal mood and affect SKIN: No obvious rash, lesion, or ulcer.     Disposition: Home  Discharge time: greater than 30 minutes.  Follow-up Information     Thornton Park, MD. Schedule an appointment as soon as possible for a visit on 06/27/2021.   Specialty: Orthopedic Surgery Why: [facility] Discharge F/UP APPOINTMENT SCHEDULED @ 10:30AM Contact information: [place] [address] [phone]         Noralee Stain, MD. Schedule an appointment as soon as possible for a visit on 06/30/2021.   Specialty: Family Medicine Why: [facility] Discharge F/UP DEC 19TH @ 3:50PM IF SOONER APPOINTMENT IS AVAILABLE OFFICE WILL CALL. Contact information: [address] [place] [address] [phone]                 Allergies as of 06/13/2021       Reactions   Aspirin    Elemental Sulfur    Penicillins    Sulfa Antibiotics    Sulfites    Sulfonylureas    Tetracyclines & Related         Medication List     STOP taking these medications    carvedilol 25 MG tablet Commonly known as: COREG   hydrALAZINE 50 MG tablet Commonly known as: APRESOLINE   losartan 50 MG tablet Commonly known as: COZAAR       TAKE these medications    aspirin 81 MG chewable tablet Chew 81 mg by mouth daily.   atorvastatin 80 MG tablet Commonly known as: LIPITOR Take 80 mg by mouth daily.   ciprofloxacin 500 MG tablet Commonly known as: Cipro  Take 1 tablet (500 mg total) by mouth 2 (two) times daily for 10 days.   clopidogrel 75 MG tablet Commonly known as: PLAVIX Take 75 mg by mouth daily.   ergocalciferol 1.25 MG (50000 UT) capsule Commonly known as: VITAMIN D2 Take 50,000 Units by mouth once a week.   ezetimibe 10 MG tablet Commonly known as: ZETIA Take 10 mg by mouth daily.   glipiZIDE 10  MG tablet Commonly known as: GLUCOTROL Take 10 mg by mouth daily before breakfast.   insulin glargine 100 UNIT/ML injection Commonly known as: LANTUS Inject 30 Units into the skin daily. Inject 30 units subcutaneously nightly   insulin regular 100 units/mL injection Commonly known as: NOVOLIN R Inject 15 Units into the skin 3 (three) times daily before meals.   Jardiance 10 MG Tabs tablet Generic drug: empagliflozin Take 10 mg by mouth daily.   methocarbamol 500 MG tablet Commonly known as: ROBAXIN Take 1 tablet (500 mg total) by mouth every 8 (eight) hours as needed for up to 3 days for muscle spasms.   mycophenolate 500 MG tablet Commonly known as: CELLCEPT Take 1,000 mg by mouth 2 (two) times daily.   oxyCODONE 5 MG immediate release tablet Commonly known as: Oxy IR/ROXICODONE Take 1 tablet (5 mg total) by mouth every 8 (eight) hours as needed for up to 3 days for breakthrough pain or severe pain (8-10).   Ozempic (0.25 or 0.5 MG/DOSE) 2 MG/1.5ML Sopn Generic drug: Semaglutide(0.25 or 0.5MG /DOS) Inject 0.25-0.5 mg into the skin every Wednesday.   pantoprazole 40 MG tablet Commonly known as: PROTONIX Take 40 mg by mouth daily.   predniSONE 5 MG tablet Commonly known as: DELTASONE Take 5 mg by mouth daily with breakfast.   tacrolimus 0.5 MG capsule Commonly known as: PROGRAF Take 0.5 mg by mouth 2 (two) times daily. Take 1 capsule by mouth at 9 am and 2 capsules by mouth at 9 pm   traMADol 50 MG tablet Commonly known as: ULTRAM Take 1 tablet (50 mg total) by mouth every 6 (six) hours for 7 days.               Discharge Care Instructions  (From admission, onward)           Start     Ordered   06/13/21 0000  Discharge wound care:       Comments: As above   06/13/21 1351            DG Tibia/Fibula Left  Result Date: 06/12/2021 CLINICAL DATA:  OPERATIVE IMAGING FROM ORIF A LEFT TIBIAL FRACTURE. EXAM: OPERATIVE LEFT TIBIA AND FIBULA 14 VIEWS; DG  C-ARM 1-60 MIN-NO REPORT COMPARISON:  06/11/2021 FLUOROSCOPY TIME:  Fluoroscopy Time:  1.41 seconds Radiation Exposure Index (if provided by the fluoroscopic device): 5.8 mGy Number of Acquired Spot Images: 14 FINDINGS: Multiple spot fluoro graphic images show placement an intramedullary rod and associated fixation screws reducing the distal tibia fracture into near anatomic alignment. There is also placement of a lateral fixation plate and screws reducing the distal fibular fracture into near anatomic alignment. Orthopedic hardware appears well seated. IMPRESSION: 1. Well-aligned distal tibia and fibular fractures following ORIF. Electronically Signed   By: Lajean Manes M.D.   On: 06/12/2021 19:44   DG Tibia/Fibula Left  Result Date: 06/11/2021 CLINICAL DATA:  Status post reduction EXAM: LEFT TIBIA AND FIBULA - 2 VIEW COMPARISON:  None. FINDINGS: Improved angulation of distal tibia and fibula fractures. There is unchanged lateral displacement of  both fractures. There is mild persistent anterior angulation. IMPRESSION: Improved angulation of distal tibia and fibula fractures. Unchanged lateral displacement. Electronically Signed   By: Ulyses Jarred M.D.   On: 06/11/2021 19:41   DG Tibia/Fibula Left  Result Date: 06/11/2021 CLINICAL DATA:  Fracture of the left lower extremity EXAM: LEFT TIBIA AND FIBULA - 2 VIEW COMPARISON:  None. FINDINGS: Displaced and angulated fractures of the distal tibial and fibular diaphysis. There is lateral angulation and displacement of the distal fracture fragments as well as anterior displacement. No dislocation. The bones are well mineralized. Sclerotic changes of the lateral femoral condyle may be chronic or represent bone contusion versus less likely infarct. Mild soft tissue swelling of the distal leg. No opaque foreign object or soft tissue gas. IMPRESSION: Displaced and angulated fractures of the distal tibial and fibular diaphysis. No dislocation. Electronically Signed    By: Anner Crete M.D.   On: 06/11/2021 17:25   DG Tibia/Fibula Left Port  Result Date: 06/12/2021 CLINICAL DATA:  Follow-up examination status post ORIF. EXAM: PORTABLE LEFT TIBIA AND FIBULA - 2 VIEW COMPARISON:  Prior radiograph from 06/11/2021. FINDINGS: Splinting material overlies the lower left leg. Postoperative changes from interval ORIF at the distal tibia and fibula. IM nail with proximal and distal interlocking screws in place within the tibia. Lateral plate screw fixation in place at the distal fibula. Hardware appears well positioned and intact. Distal tibial/fibular fractures in gross anatomic alignment status post fixation. Overlying soft tissue swelling and skin staples with scattered postoperative emphysema. No adverse features. IMPRESSION: Postoperative changes from interval ORIF at the distal left tibia and fibula without adverse features. Electronically Signed   By: Jeannine Boga M.D.   On: 06/12/2021 20:32   DG C-Arm 1-60 Min-No Report  Result Date: 06/12/2021 Fluoroscopy was utilized by the requesting physician.  No radiographic interpretation.   Results for orders placed or performed during the hospital encounter of 06/11/21  Resp Panel by RT-PCR (Flu A&B, Covid) Nasopharyngeal Swab     Status: None   Collection Time: 06/11/21  5:14 PM   Specimen: Nasopharyngeal Swab; Nasopharyngeal(NP) swabs in vial transport medium  Result Value Ref Range Status   SARS Coronavirus 2 by RT PCR NEGATIVE NEGATIVE Final    Comment: (NOTE) SARS-CoV-2 target nucleic acids are NOT DETECTED.  The SARS-CoV-2 RNA is generally detectable in upper respiratory specimens during the acute phase of infection. The lowest concentration of SARS-CoV-2 viral copies this assay can detect is 138 copies/mL. A negative result does not preclude SARS-Cov-2 infection and should not be used as the sole basis for treatment or other patient management decisions. A negative result may occur with  improper  specimen collection/handling, submission of specimen other than nasopharyngeal swab, presence of viral mutation(s) within the areas targeted by this assay, and inadequate number of viral copies(<138 copies/mL). A negative result must be combined with clinical observations, patient history, and epidemiological information. The expected result is Negative.  Fact Sheet for Patients:  EntrepreneurPulse.com.au  Fact Sheet for Healthcare Providers:  IncredibleEmployment.be  This test is no t yet approved or cleared by the [place] FDA and  has been authorized for detection and/or diagnosis of SARS-CoV-2 by FDA under an Emergency Use Authorization (EUA). This EUA will remain  in effect (meaning this test can be used) for the duration of the COVID-19 declaration under Section 564(b)(1) of the Act, 21 U.S.C.section 360bbb-3(b)(1), unless the authorization is terminated  or revoked sooner.       Influenza  A by PCR NEGATIVE NEGATIVE Final   Influenza B by PCR NEGATIVE NEGATIVE Final    Comment: (NOTE) The Xpert Xpress SARS-CoV-2/FLU/RSV plus assay is intended as an aid in the diagnosis of influenza from Nasopharyngeal swab specimens and should not be used as a sole basis for treatment. Nasal washings and aspirates are unacceptable for Xpert Xpress SARS-CoV-2/FLU/RSV testing.  Fact Sheet for Patients: EntrepreneurPulse.com.au  Fact Sheet for Healthcare Providers: IncredibleEmployment.be  This test is not yet approved or cleared by the [place] FDA and has been authorized for detection and/or diagnosis of SARS-CoV-2 by FDA under an Emergency Use Authorization (EUA). This EUA will remain in effect (meaning this test can be used) for the duration of the COVID-19 declaration under Section 564(b)(1) of the Act, 21 U.S.C. section 360bbb-3(b)(1), unless the authorization is terminated or revoked.  Performed at  [facility], [address], [place], [place] [address]     Signed:  Max Sane MD.  [facility] 06/15/2021, 3:56 PM

## 2021-07-02 ENCOUNTER — Other Ambulatory Visit: Payer: Self-pay | Admitting: Family Medicine

## 2021-07-02 ENCOUNTER — Ambulatory Visit
Admission: RE | Admit: 2021-07-02 | Discharge: 2021-07-02 | Disposition: A | Discharge status: Home / Self Care | Payer: PRIVATE HEALTH INSURANCE | Source: Ambulatory Visit | Attending: Family Medicine | Admitting: Family Medicine

## 2021-07-02 ENCOUNTER — Other Ambulatory Visit: Payer: Self-pay

## 2021-07-02 DIAGNOSIS — N179 Acute kidney failure, unspecified: Secondary | ICD-10-CM | POA: Insufficient documentation

## 2021-07-02 DIAGNOSIS — R7989 Other specified abnormal findings of blood chemistry: Secondary | ICD-10-CM

## 2021-07-02 DIAGNOSIS — R0602 Shortness of breath: Secondary | ICD-10-CM | POA: Diagnosis present

## 2021-07-02 MED ORDER — TECHNETIUM TO 99M ALBUMIN AGGREGATED
4.0000 | Freq: Once | INTRAVENOUS | Status: AC | PRN
  Administered 2021-07-02: 14:00:00 4.32 via INTRAVENOUS

## 2021-07-08 ENCOUNTER — Encounter: Payer: Self-pay | Admitting: Orthopedic Surgery

## 2021-09-08 ENCOUNTER — Ambulatory Visit: Payer: PRIVATE HEALTH INSURANCE | Admitting: Physician Assistant

## 2022-12-19 IMAGING — XA DG TIBIA/FIBULA 2V*L*
1 series · 14 of 14 positions shown · non-contrast
Comparison: 06/11/2021

CLINICAL DATA: OPERATIVE IMAGING FROM ORIF A LEFT TIBIAL FRACTURE.

EXAM:
OPERATIVE LEFT TIBIA AND FIBULA 14 VIEWS; DG C-ARM 1-60 MIN-NO
REPORT

[Series 1: dg x-ray · 0.20mm/px · 14 of 14 slices shown]
[im 1/14]
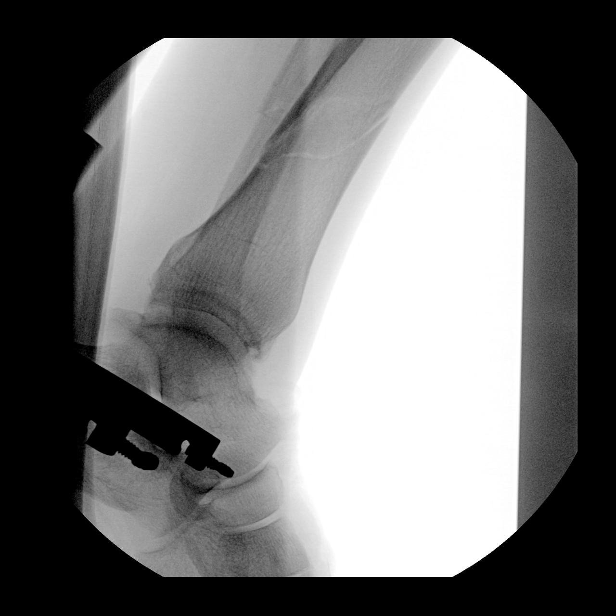
[im 2/14]
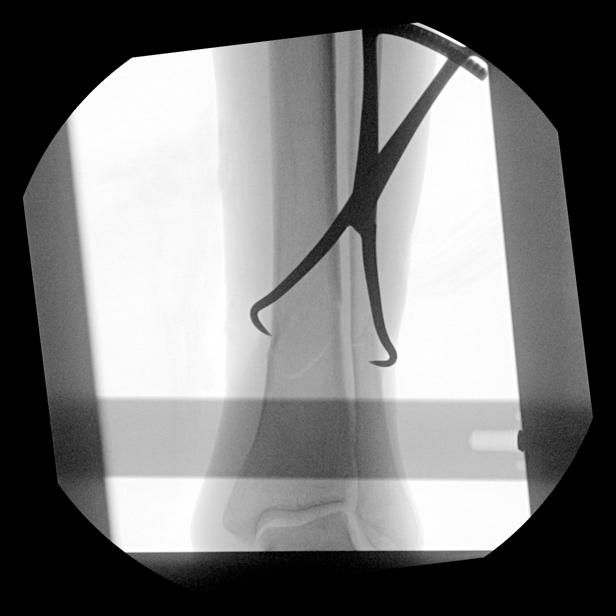
[im 3/14]
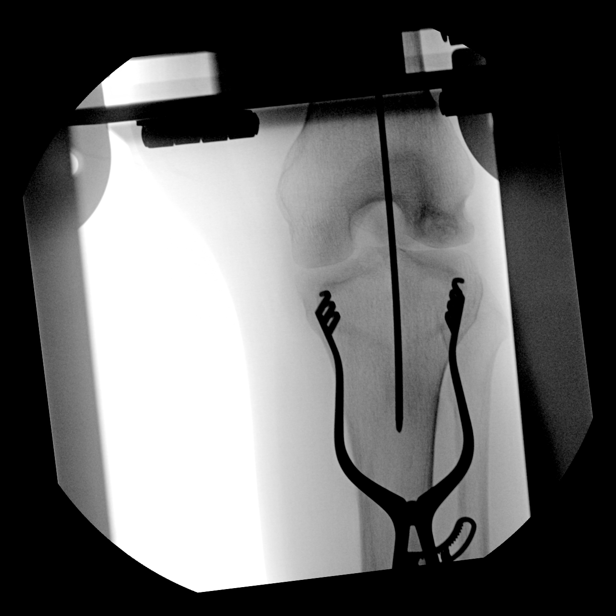
[im 4/14]
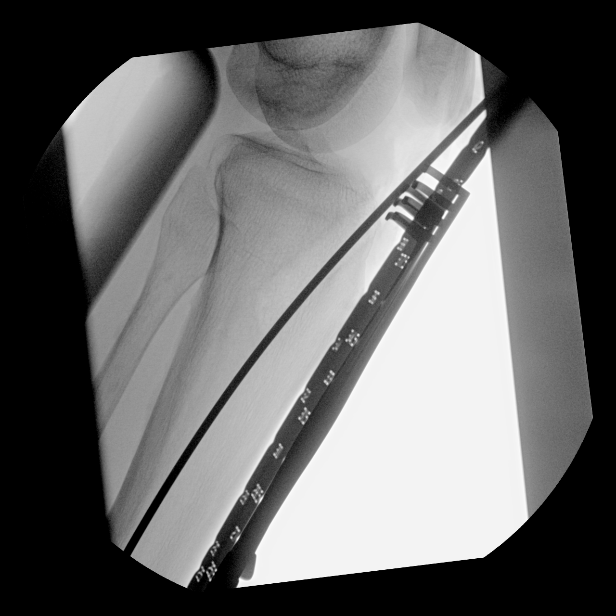
[im 5/14]
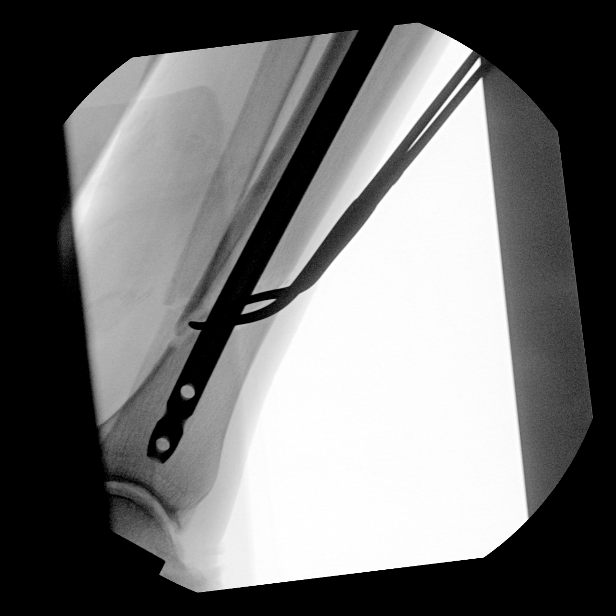
[im 6/14]
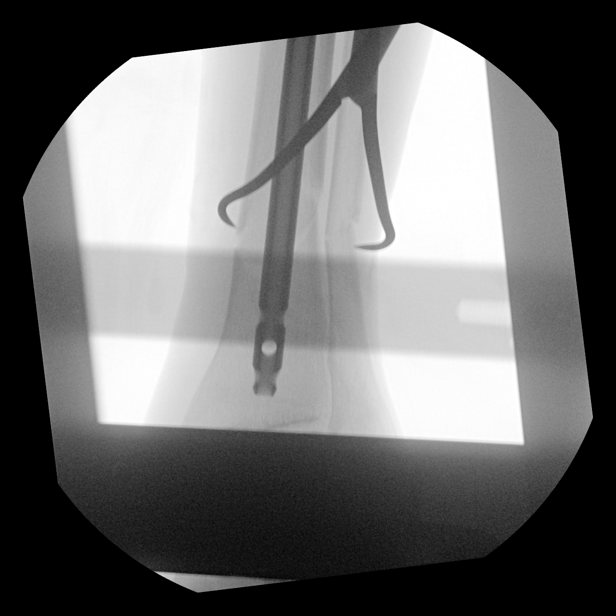
[im 7/14]
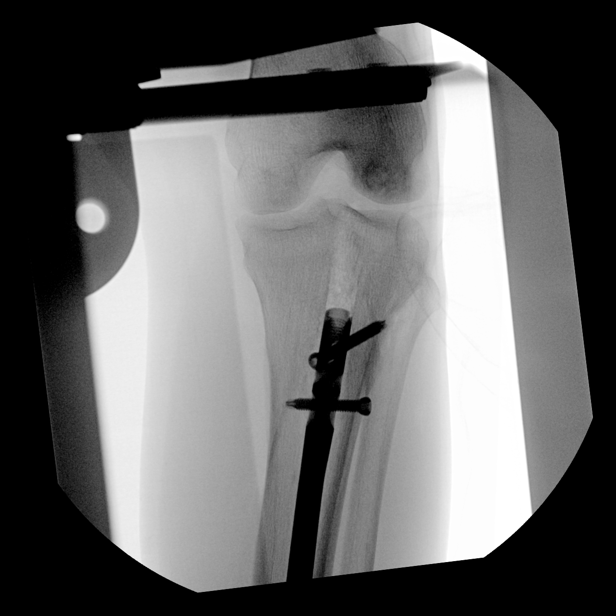
[im 8/14]
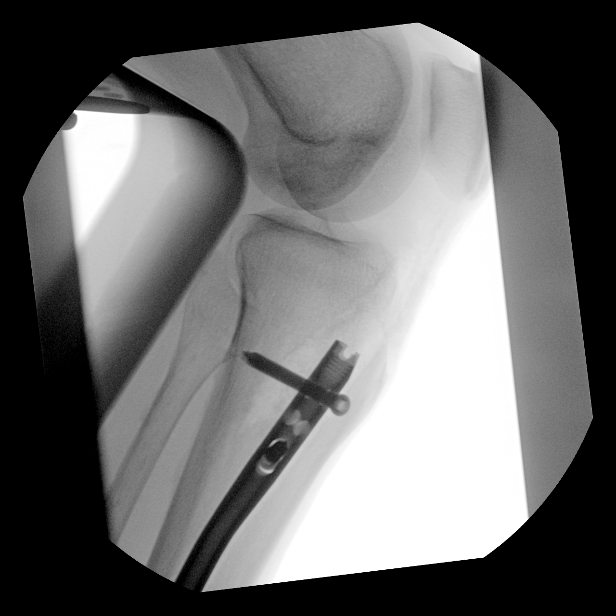
[im 9/14]
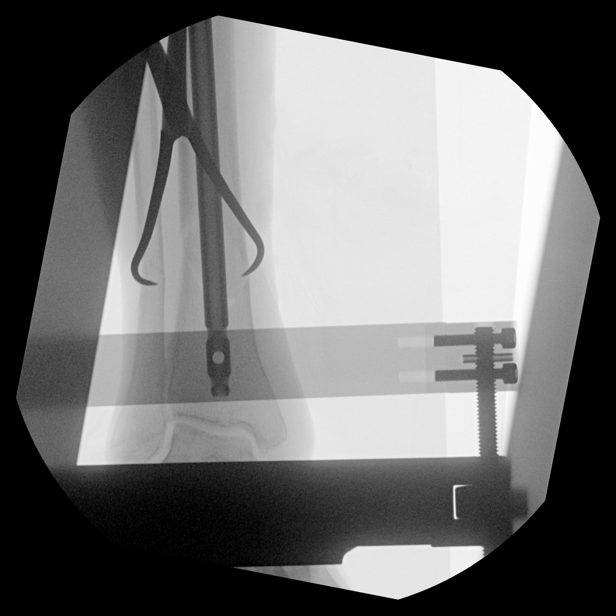
[im 10/14]
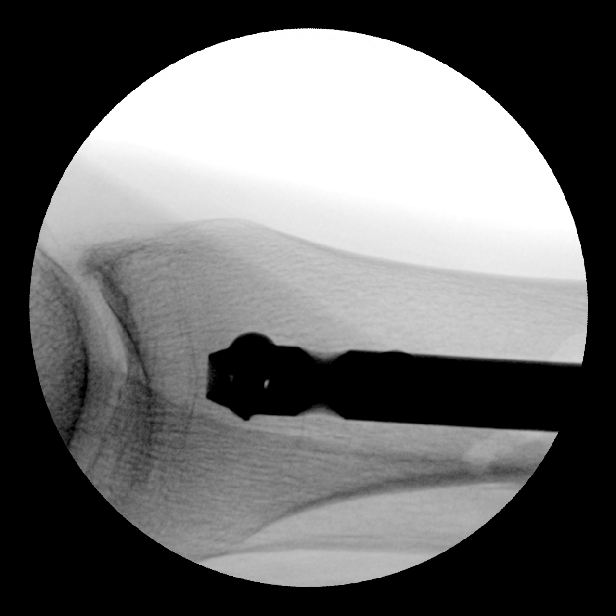
[im 11/14]
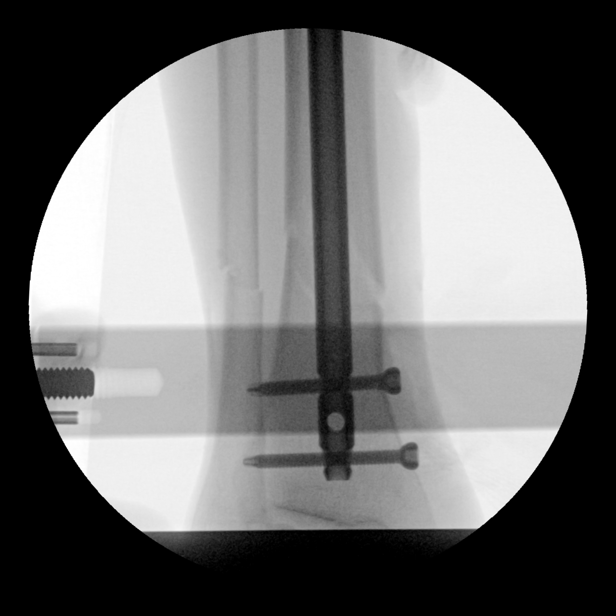
[im 12/14]
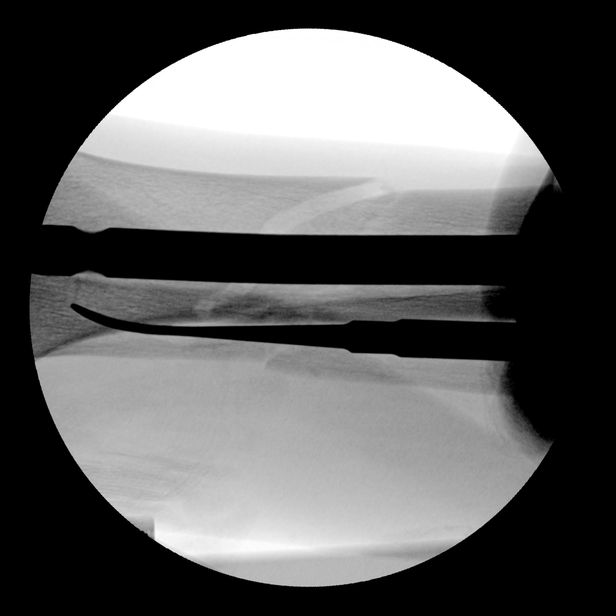
[im 13/14]
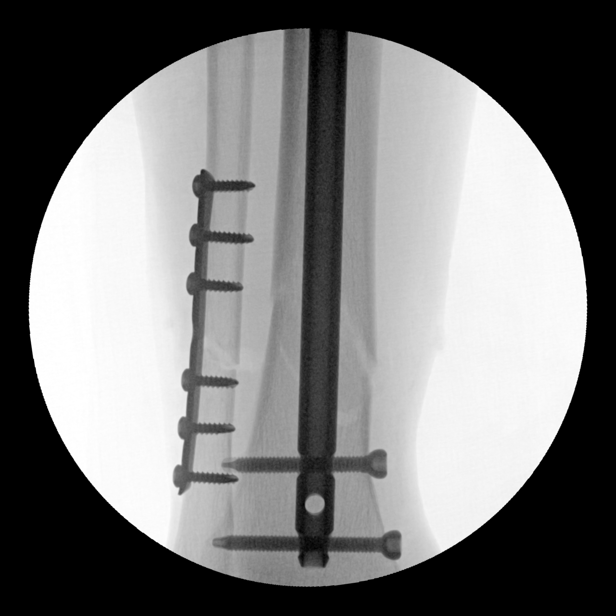
[im 14/14]
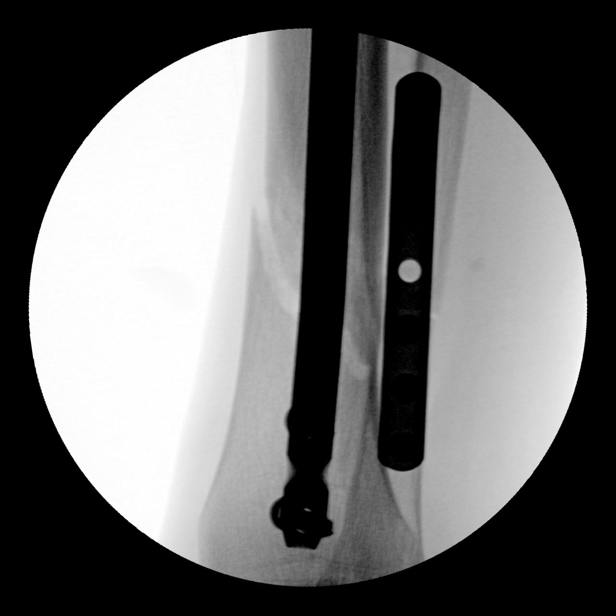

[14 of 14 positions shown; findings below may reference images not displayed]

FLUOROSCOPY TIME:  Fluoroscopy Time:  1.41 seconds

Radiation Exposure Index (if provided by the fluoroscopic device):
5.8 mGy

Number of Acquired Spot Images: 14
FINDINGS: Multiple spot fluoro graphic images show placement an intramedullary
rod and associated fixation screws reducing the distal tibia
fracture into near anatomic alignment. There is also placement of a
lateral fixation plate and screws reducing the distal fibular
fracture into near anatomic alignment. Orthopedic hardware appears
well seated.
IMPRESSION: 1. Well-aligned distal tibia and fibular fractures following ORIF.

## 2023-03-20 ENCOUNTER — Emergency Department
Admission: EM | Admit: 2023-03-20 | Discharge: 2023-03-20 | Disposition: A | Discharge status: Home / Self Care | Payer: PRIVATE HEALTH INSURANCE | Attending: Student in an Organized Health Care Education/Training Program | Admitting: Student in an Organized Health Care Education/Training Program

## 2023-03-20 ENCOUNTER — Emergency Department: Payer: PRIVATE HEALTH INSURANCE

## 2023-03-20 ENCOUNTER — Other Ambulatory Visit: Payer: Self-pay

## 2023-03-20 DIAGNOSIS — E119 Type 2 diabetes mellitus without complications: Secondary | ICD-10-CM | POA: Diagnosis not present

## 2023-03-20 DIAGNOSIS — M5416 Radiculopathy, lumbar region: Secondary | ICD-10-CM | POA: Insufficient documentation

## 2023-03-20 DIAGNOSIS — M25552 Pain in left hip: Secondary | ICD-10-CM | POA: Diagnosis present

## 2023-03-20 DIAGNOSIS — I10 Essential (primary) hypertension: Secondary | ICD-10-CM | POA: Insufficient documentation

## 2023-03-20 LAB — BASIC METABOLIC PANEL
Anion gap: 10 (ref 5–15)
BUN: 15 mg/dL (ref 6–20)
CO2: 22 mmol/L (ref 22–32)
Calcium: 9.9 mg/dL (ref 8.9–10.3)
Chloride: 103 mmol/L (ref 98–111)
Creatinine, Ser: 1.04 mg/dL (ref 0.61–1.24)
GFR, Estimated: >60 mL/min (ref >60)
Glucose, Bld: 209 mg/dL — ABNORMAL HIGH (ref 70–99)
Potassium: 4.1 mmol/L (ref 3.5–5.1)
Sodium: 135 mmol/L (ref 135–145)

## 2023-03-20 LAB — CBC WITH DIFFERENTIAL/PLATELET
Abs Immature Granulocytes: 0.01 10*3/uL (ref 0.00–0.07)
Basophils Absolute: 0 10*3/uL (ref 0.0–0.1)
Basophils Relative: 0 %
Eosinophils Absolute: 0.1 10*3/uL (ref 0.0–0.5)
Eosinophils Relative: 2 %
HCT: 44.9 % (ref 39.0–52.0)
Hemoglobin: 14.5 g/dL (ref 13.0–17.0)
Immature Granulocytes: 0 %
Lymphocytes Relative: 32 %
Lymphs Abs: 1.6 10*3/uL (ref 0.7–4.0)
MCH: 28.7 pg (ref 26.0–34.0)
MCHC: 32.3 g/dL (ref 30.0–36.0)
MCV: 88.9 fL (ref 80.0–100.0)
Monocytes Absolute: 0.5 10*3/uL (ref 0.1–1.0)
Monocytes Relative: 9 %
Neutro Abs: 3 10*3/uL (ref 1.7–7.7)
Neutrophils Relative %: 57 %
Platelets: 176 10*3/uL (ref 150–400)
RBC: 5.05 MIL/uL (ref 4.22–5.81)
RDW: 13.5 % (ref 11.5–15.5)
WBC: 5.2 10*3/uL (ref 4.0–10.5)
nRBC: 0 % (ref 0.0–0.2)

## 2023-03-20 MED ORDER — ACETAMINOPHEN 325 MG PO TABS
650.0000 mg | ORAL_TABLET | Freq: Once | ORAL | Status: DC
  Filled 2023-03-20: qty 2

## 2023-03-20 MED ORDER — OXYCODONE HCL 5 MG PO TABS
5.0000 mg | ORAL_TABLET | Freq: Four times a day (QID) | ORAL | 0 refills | Status: AC | PRN
Start: 2023-03-20 — End: ?

## 2023-03-20 MED ORDER — LIDOCAINE 5 % EX PTCH
1.0000 | MEDICATED_PATCH | CUTANEOUS | Status: DC
  Filled 2023-03-20: qty 1

## 2023-03-20 MED ORDER — IOHEXOL 300 MG/ML  SOLN
100.0000 mL | Freq: Once | INTRAMUSCULAR | Status: AC | PRN
  Administered 2023-03-20: 100 mL via INTRAVENOUS

## 2023-03-20 MED ORDER — OXYCODONE HCL 5 MG PO TABS
5.0000 mg | ORAL_TABLET | Freq: Once | ORAL | Status: AC
  Administered 2023-03-20: 5 mg via ORAL
  Filled 2023-03-20: qty 1

## 2023-03-20 NOTE — ED Provider Notes (Signed)
----------------------------------------- 3:21 PM on 03/20/2023 -----------------------------------------  Blood pressure (!) 117/95, pulse 96, temperature 98 F (36.7 C), temperature source Oral, resp. rate 18, height 5\' 9"  (1.753 m), weight 72.6 kg, SpO2 96%.  Assuming care from [facility], PA-C/NP-C.  In short, James Riggs is a 57 y.o. male with a chief complaint of Hip Pain (LEFT hip pain that began approx. 2.5 weeks ago; He did have surgery on his hip 2 years ago (long term prednisone use); He denies any recent trauma to the area; He reports that sometimes the pain begins in his lower back then shoots down his leg; Was recently seen at [facility] and given rx for Flexeril with no relief) .  Refer to the original H&P for additional details.  The current plan of care is to await pending CT results and disposition the patient accordingly.  He currently is stable after ED medication administration. ____________________________________________    ED Results / Procedures / Treatments   Labs (all labs ordered are listed, but only abnormal results are displayed) Labs Reviewed  BASIC METABOLIC PANEL - Abnormal; Notable for the following components:      Result Value   Glucose, Bld 209 (*)    All other components within normal limits  CBC WITH DIFFERENTIAL/PLATELET     EKG  RADIOLOGY  I personally viewed and evaluated these images as part of my medical decision making, as well as reviewing the written report by the radiologist.  ED Provider Interpretation: No acute findings, questionable lytic lesion to the left pelvic wing concerning for metastatic disease versus benign cystic lesion}  CT ABDOMEN PELVIS W CONTRAST  Result Date: 03/20/2023 CLINICAL DATA:  Acute left hip pain for several weeks. EXAM: CT ABDOMEN AND PELVIS WITH CONTRAST TECHNIQUE: Multidetector CT imaging of the abdomen and pelvis was performed using the standard protocol following bolus administration of intravenous  contrast. RADIATION DOSE REDUCTION: This exam was performed according to the departmental dose-optimization program which includes automated exposure control, adjustment of the mA and/or kV according to patient size and/or use of iterative reconstruction technique. CONTRAST:  OMNIPAQUE IOHEXOL 300 MG/ML  SOLN COMPARISON:  None Available. FINDINGS: Lower chest: No acute abnormality. Hepatobiliary: No focal liver abnormality is seen. No gallstones, gallbladder wall thickening, or biliary dilatation. Pancreas: Unremarkable. No pancreatic ductal dilatation or surrounding inflammatory changes. Spleen: Normal in size without focal abnormality. Adrenals/Urinary Tract: Adrenal glands appear normal. Bilateral native renal atrophy is noted consistent with end-stage renal disease. Renal transplant is noted in left lower quadrant without hydronephrosis. Urinary bladder is unremarkable. Probable old calcified renal transplant seen in right lower quadrant. Stomach/Bowel: Stomach is within normal limits. Appendix appears normal. No evidence of bowel wall thickening, distention, or inflammatory changes. Vascular/Lymphatic: Aortic atherosclerosis. No enlarged abdominal or pelvic lymph nodes. Reproductive: Prostate is unremarkable. Other: No abdominal wall hernia or abnormality. No abdominopelvic ascites. Musculoskeletal: Status post bilateral total hip arthroplasties. 17 mm well-circumscribed lucency is seen in left iliac wing. Severe degenerative disc disease is noted at L3-4 with anterior osteophyte formation. IMPRESSION: Renal transplant is noted in left lower quadrant without hydronephrosis. Status post bilateral total hip arthroplasties. Severe degenerative disc disease is noted at L3-4. 17 mm well-circumscribed lucency is seen in left iliac wing. Lytic lesion secondary to metastatic disease cannot be excluded if there is a history of primary malignancy. Otherwise, this most likely represents benign bony cyst.  Electronically Signed   By: Lupita Raider M.D.   On: 03/20/2023 15:40   DG Hip  Unilat With Pelvis 2-3 Views Left  Result Date: 03/20/2023 CLINICAL DATA:  Left hip pain. EXAM: DG HIP (WITH OR WITHOUT PELVIS) 2-3V LEFT COMPARISON:  None Available. FINDINGS: Left total hip arthroplasty is noted. The femoral and acetabular components are well seated. Hip is located. Atherosclerotic calcifications are present. Calcifications project over the right SI joint. IMPRESSION: 1. Left total hip arthroplasty without radiographic evidence for complication. 2. Atherosclerosis. Electronically Signed   By: Marin Roberts M.D.   On: 03/20/2023 13:56     PROCEDURES:  Critical Care performed: No  Procedures   MEDICATIONS ORDERED IN ED: Medications  acetaminophen (TYLENOL) tablet 650 mg (650 mg Oral Not Given 03/20/23 1434)  lidocaine (LIDODERM) 5 % 1 patch (1 patch Transdermal Patient Refused/Not Given 03/20/23 1434)  iohexol (OMNIPAQUE) 300 MG/ML solution 100 mL (100 mLs Intravenous Contrast Given 03/20/23 1342)  oxyCODONE (Oxy IR/ROXICODONE) immediate release tablet 5 mg (5 mg Oral Given 03/20/23 1433)     IMPRESSION / MDM / ASSESSMENT AND PLAN / ED COURSE  I reviewed the triage vital signs and the nursing notes.                              Differential diagnosis includes, but is not limited to, iliopsoas abscess, DDD, radiculopathy  Patient's presentation is most consistent with acute complicated illness / injury requiring diagnostic workup.  Patient's diagnosis is consistent with left hip pain.  Patient without any history of primary malignancy to raise concern for metastatic disease on CT images, related to the radiologist comments.  Symptoms consistent with likely benign sclerotic lesion.  Patient will be discharged home with prescriptions for oxycodone. Patient is to follow up with primary provider or specialist as discussed, as needed or otherwise directed. Patient is given ED precautions to  return to the ED for any worsening or new symptoms.  FINAL CLINICAL IMPRESSION(S) / ED DIAGNOSES   Final diagnoses:  Left hip pain  Lumbar radiculopathy     Rx / DC Orders   ED Discharge Orders          Ordered    oxyCODONE (ROXICODONE) 5 MG immediate release tablet  Every 6 hours PRN        03/20/23 1517             Note:  This document was prepared using Dragon voice recognition software and may include unintentional dictation errors.    Lissa Hoard, PA-C 03/20/23 1646    Trinna Post, MD 03/20/23 [phone]

## 2023-03-20 NOTE — ED Triage Notes (Signed)
LEFT hip pain that began approx. 2.5 weeks ago; He did have surgery on his hip 2 years ago (long term prednisone use); He denies any recent trauma to the area; He reports that sometimes the pain begins in his lower back then shoots down his leg; Was recently seen at [facility] and given rx for Flexeril with no relief

## 2023-03-20 NOTE — ED Provider Notes (Signed)
[facility] Provider Note    Event Date/Time   First MD Initiated Contact with Patient 03/20/23 1129     (approximate)   History   Hip Pain (LEFT hip pain that began approx. 2.5 weeks ago; He did have surgery on his hip 2 years ago (long term prednisone use); He denies any recent trauma to the area; He reports that sometimes the pain begins in his lower back then shoots down his leg; Was recently seen at [facility] and given rx for Flexeril with no relief)   HPI  James Riggs is a 57 y.o. male with a past medical history of kidney transplant on tacrolimus, mycophenolate, and daily prednisone, type 2 diabetes, hyperlipidemia who presents today for evaluation of left hip pain.  Patient reports that this is been ongoing for the past 2.5 weeks.  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  He reports that the pain radiates down to approximately his knee and occasionally to his ankle.  He reports that he has pain with ambulation and with sitting, minimal pain at rest.  He reports that he had a fever approximately 3 weeks ago but also had a cough and runny nose and thinks that he had COVID.  He has not had a fever since then.  No chills.  No abdominal pain.  No nausea, vomiting, diarrhea.  Patient Active Problem List   Diagnosis Date Noted   Type I or II open fracture of left tibia and fibula    Surgery, elective    Type 2 diabetes mellitus with hyperlipidemia (HCC) 06/12/2021   Renal transplant, status post 06/12/2021   Essential hypertension 06/12/2021   Tibia/fibula fracture, left, closed, initial encounter 06/11/2021          Physical Exam   Triage Vital Signs: ED Triage Vitals  Encounter Vitals Group     BP 03/20/23 1125 92/69     Systolic BP Percentile --      Diastolic BP Percentile --      Pulse Rate 03/20/23 1125 (!) 105     Resp 03/20/23 1125 19     Temp 03/20/23 1125 98 F (36.7 C)     Temp Source 03/20/23  1125 Oral     SpO2 03/20/23 1125 97 %     Weight 03/20/23 1123 160 lb (72.6 kg)     Height 03/20/23 1123 5\' 9"  (1.753 m)     Head Circumference --      Peak Flow --      Pain Score 03/20/23 1122 7     Pain Loc --      Pain Education --      Exclude from Growth Chart --     Most recent vital signs: Vitals:   03/20/23 1125 03/20/23 1446  BP: 92/69 (!) 117/95  Pulse: (!) 105 96  Resp: 19 18  Temp: 98 F (36.7 C)   SpO2: 97% 96%    Physical Exam Vitals and nursing note reviewed.  Constitutional:      General: Awake and alert. No acute distress.    Appearance: Normal appearance. The patient is normal weight.  HENT:     Head: Normocephalic and atraumatic.     Mouth: Mucous membranes are moist.  Eyes:     General: PERRL. Normal EOMs        Right eye: No discharge.        Left eye: No discharge.     Conjunctiva/sclera:  Conjunctivae normal.  Cardiovascular:     Rate and Rhythm: Normal rate and regular rhythm.     Pulses: Normal pulses.  Pulmonary:     Effort: Pulmonary effort is normal. No respiratory distress.     Breath sounds: Normal breath sounds.  Abdominal:     Abdomen is soft. There is no abdominal tenderness. No rebound or guarding. No distention. Musculoskeletal:        General: No swelling. Normal range of motion.     Cervical back: Normal range of motion and neck supple.  Pelvis stable.  Able to actively internally and externally rotate bilateral hips against resistance, able to flex and extend bilateral hips against resistance.  No vertebral tenderness. No midline vertebral tenderness.  No buttock tenderness.  Strength and sensation 5/5 to bilateral lower extremities. Normal great toe extension against resistance. Normal sensation throughout feet. Normal patellar reflexes. Negative SLR and opposite SLR bilaterally. Skin:    General: Skin is warm and dry.     Capillary Refill: Capillary refill takes less than 2 seconds.     Findings: No rash.  Neurological:      Mental Status: The patient is awake and alert.      ED Results / Procedures / Treatments   Labs (all labs ordered are listed, but only abnormal results are displayed) Labs Reviewed  BASIC METABOLIC PANEL - Abnormal; Notable for the following components:      Result Value   Glucose, Bld 209 (*)    All other components within normal limits  CBC WITH DIFFERENTIAL/PLATELET     EKG     RADIOLOGY I independently reviewed and interpreted imaging and agree with radiologists findings.     PROCEDURES:  Critical Care performed:   Procedures   MEDICATIONS ORDERED IN ED: Medications  acetaminophen (TYLENOL) tablet 650 mg (650 mg Oral Not Given 03/20/23 1434)  lidocaine (LIDODERM) 5 % 1 patch (1 patch Transdermal Patient Refused/Not Given 03/20/23 1434)  iohexol (OMNIPAQUE) 300 MG/ML solution 100 mL (100 mLs Intravenous Contrast Given 03/20/23 1342)  oxyCODONE (Oxy IR/ROXICODONE) immediate release tablet 5 mg (5 mg Oral Given 03/20/23 1433)     IMPRESSION / MDM / ASSESSMENT AND PLAN / ED COURSE  I reviewed the triage vital signs and the nursing notes.   Differential diagnosis includes, but is not limited to, lumbar radiculopathy, abscess, psoas strain, piriformis syndrome.  Patient is awake and alert, mildly tachycardic to 105 on arrival and hypotensive to 92/69 but well in appearance.  He is afebrile.  He has normal strength and sensation in bilateral lower extremities, no midline vertebral tenderness.  Patient has numerous comorbidities including he is immunocompromised on daily prednisone, mycophenolate, and tacrolimus.  For this reason, I recommended more workup including labs, IV, and CT scan for further evaluation of possible psoas abscess or other etiology.  Patient and significant other are in agreement with this plan.  Labs are overall reassuring.  X-ray does not reveal any acute osseous injury or loosening of his hardware.  Given that patient cannot have NSAIDs and that he  is already has taken Tylenol at home, he was given a dose of oxycodone while awaiting CT scan results.  Vital signs rechecked and are improved with a blood pressure of 117/95 and resolved tachycardia.  Patient was passed off to oncoming provider Ronald Lobo, PA-C pending CT results and final disposition.  If CT is negative I suspect lumbar radiculopathy is most likely etiology.  Patient was given a prescription for oxycodone which  she was instructed to use only for breakthrough pain when Tylenol and ibuprofen do not work first.  He was reminded that this medication is highly addictive.  He was also reminded that he cannot drive, operate heavy machinery, or perform any test that require concentration while taking this medication.   Patient's presentation is most consistent with acute presentation with potential threat to life or bodily function.      FINAL CLINICAL IMPRESSION(S) / ED DIAGNOSES   Final diagnoses:  Left hip pain  Lumbar radiculopathy     Rx / DC Orders   ED Discharge Orders          Ordered    oxyCODONE (ROXICODONE) 5 MG immediate release tablet  Every 6 hours PRN        03/20/23 1517             Note:  This document was prepared using Dragon voice recognition software and may include unintentional dictation errors.   Keturah Shavers 03/20/23 1526    Willy Eddy, MD 03/22/23 1116

## 2023-03-20 NOTE — Discharge Instructions (Addendum)
Your blood work, x-ray, and CT scans are reassuring.  You may take the oxycodone as needed for pain, though please remember that this should only be used for breakthrough pain when Tylenol and Lidoderm patches do not work first as oxycodone is highly addictive.  Also, remember that you cannot drive, operate heavy machinery, or perform any test that require concentration while taking this medication.  Please follow-up with your outpatient provider this week.  Please return for any new, worsening, or change in symptoms or other concerns.  It was a pleasure caring for you today.

## 2023-03-22 NOTE — Group Note (Deleted)

## 2023-06-05 ENCOUNTER — Emergency Department: Payer: PRIVATE HEALTH INSURANCE

## 2023-06-05 ENCOUNTER — Emergency Department
Admission: EM | Admit: 2023-06-05 | Discharge: 2023-06-05 | Disposition: A | Discharge status: Home / Self Care | Payer: PRIVATE HEALTH INSURANCE | Attending: Emergency Medicine | Admitting: Emergency Medicine

## 2023-06-05 ENCOUNTER — Other Ambulatory Visit: Payer: Self-pay

## 2023-06-05 DIAGNOSIS — E119 Type 2 diabetes mellitus without complications: Secondary | ICD-10-CM | POA: Diagnosis not present

## 2023-06-05 DIAGNOSIS — Z1152 Encounter for screening for COVID-19: Secondary | ICD-10-CM | POA: Diagnosis not present

## 2023-06-05 DIAGNOSIS — I959 Hypotension, unspecified: Secondary | ICD-10-CM | POA: Insufficient documentation

## 2023-06-05 DIAGNOSIS — I10 Essential (primary) hypertension: Secondary | ICD-10-CM | POA: Insufficient documentation

## 2023-06-05 DIAGNOSIS — R42 Dizziness and giddiness: Secondary | ICD-10-CM | POA: Insufficient documentation

## 2023-06-05 DIAGNOSIS — E860 Dehydration: Secondary | ICD-10-CM

## 2023-06-05 LAB — URINALYSIS, COMPLETE (UACMP) WITH MICROSCOPIC
Bacteria, UA: NONE SEEN
Bilirubin Urine: NEGATIVE
Glucose, UA: >=500 mg/dL — AB
Hgb urine dipstick: NEGATIVE
Ketones, ur: 5 mg/dL — AB
Leukocytes,Ua: NEGATIVE
Nitrite: NEGATIVE
Protein, ur: NEGATIVE mg/dL
Specific Gravity, Urine: 1.016 (ref 1.005–1.030)
Squamous Epithelial / HPF: 0 /[HPF] (ref 0–5)
WBC, UA: 0 WBC/hpf (ref 0–5)
pH: 5 (ref 5.0–8.0)

## 2023-06-05 LAB — CBC
HCT: 46.9 % (ref 39.0–52.0)
Hemoglobin: 15.6 g/dL (ref 13.0–17.0)
MCH: 28.8 pg (ref 26.0–34.0)
MCHC: 33.3 g/dL (ref 30.0–36.0)
MCV: 86.5 fL (ref 80.0–100.0)
Platelets: 219 10*3/uL (ref 150–400)
RBC: 5.42 MIL/uL (ref 4.22–5.81)
RDW: 12.4 % (ref 11.5–15.5)
WBC: 7.6 10*3/uL (ref 4.0–10.5)
nRBC: 0 % (ref 0.0–0.2)

## 2023-06-05 LAB — URIC ACID: Uric Acid, Serum: 10 mg/dL — ABNORMAL HIGH (ref 3.7–8.6)

## 2023-06-05 LAB — COMPREHENSIVE METABOLIC PANEL
ALT: 14 U/L (ref 0–44)
AST: 19 U/L (ref 15–41)
Albumin: 3.5 g/dL (ref 3.5–5.0)
Alkaline Phosphatase: 104 U/L (ref 38–126)
Anion gap: 10 (ref 5–15)
BUN: 31 mg/dL — ABNORMAL HIGH (ref 6–20)
CO2: 23 mmol/L (ref 22–32)
Calcium: 10.9 mg/dL — ABNORMAL HIGH (ref 8.9–10.3)
Chloride: 103 mmol/L (ref 98–111)
Creatinine, Ser: 1.18 mg/dL (ref 0.61–1.24)
GFR, Estimated: >60 mL/min (ref >60)
Glucose, Bld: 168 mg/dL — ABNORMAL HIGH (ref 70–99)
Potassium: 4.4 mmol/L (ref 3.5–5.1)
Sodium: 136 mmol/L (ref 135–145)
Total Bilirubin: 1.1 mg/dL (ref <1.2)
Total Protein: 8.3 g/dL — ABNORMAL HIGH (ref 6.5–8.1)

## 2023-06-05 LAB — RESP PANEL BY RT-PCR (RSV, FLU A&B, COVID)  RVPGX2
Influenza A by PCR: NEGATIVE
Influenza B by PCR: NEGATIVE
Resp Syncytial Virus by PCR: NEGATIVE
SARS Coronavirus 2 by RT PCR: NEGATIVE

## 2023-06-05 LAB — TROPONIN I (HIGH SENSITIVITY): Troponin I (High Sensitivity): 17 ng/L (ref <18)

## 2023-06-05 MED ORDER — PREDNISONE 50 MG PO TABS
50.0000 mg | ORAL_TABLET | Freq: Every day | ORAL | 0 refills | Status: AC
Start: 2023-06-05 — End: 2023-06-10

## 2023-06-05 MED ORDER — SODIUM CHLORIDE 0.9 % IV BOLUS
1000.0000 mL | Freq: Once | INTRAVENOUS | Status: AC
  Administered 2023-06-05: 1000 mL via INTRAVENOUS

## 2023-06-05 MED ORDER — LIDOCAINE 5 % EX PTCH
1.0000 | MEDICATED_PATCH | Freq: Once | CUTANEOUS | Status: DC
  Administered 2023-06-05: 1 via TRANSDERMAL
  Filled 2023-06-05: qty 1

## 2023-06-05 MED ORDER — LIDOCAINE 5 % EX PTCH: 1.0000 | MEDICATED_PATCH | CUTANEOUS | 0 refills | Status: AC

## 2023-06-05 NOTE — ED Triage Notes (Signed)
First Nurse Note;  Pt via [facility] from home. Pt had a fall on Tuesday, pt c/o lower back pain. States since then his BP has been dropping intermittently. Pt is A&Ox4 and NAD  EMS reports  113/75 BP 98% on RA 108 HR

## 2023-06-05 NOTE — ED Notes (Signed)
Pt given urinal and instructed to let this nurse know when he has provided a sample to send to lab.

## 2023-06-05 NOTE — ED Triage Notes (Signed)
Pt states BP dropped on Tuesday morning and he blacked out & fell; ambulance was called, but he refused to go to hospital. States he stayed in bed the rest of the week, but since then he has had right mid back pain. States he wanted to come to the ED today because his BP is still low and he wanted to make sure he didn't break anything when he fell.

## 2023-06-05 NOTE — ED Provider Notes (Signed)
-----------------------------------------   4:58 PM on 06/05/2023 -----------------------------------------  I took over care of this patient from Dr. Lenard Lance.  Urinalysis shows some glucose but otherwise no evidence of UTI and no other concerning findings.  The patient's blood pressure has remained stable and he states he is feeling significantly better.  He appears comfortable.  He would like to go home.  The patient is interested in something for pain for his back.  He reports that almost any opiate or muscle relaxer makes him quite sleepy and he needs to be able to drive and work.  He cannot take NSAIDs due to his kidney issues.  I recommended either Tylenol but also offered a Lidoderm patch which could be a better option for the patient and he agrees.  He also reports that he is having some increased gout symptoms over the last few days and since his uric acid is slightly elevated, he would like to go on another course of prednisone which helped him earlier this month.  I have prescribed a similar 5-day course of 50 mg daily.  I counseled the patient on the results of the workup and plan of care.  I gave strict return precautions and he expressed understanding.   Dionne Bucy, MD 06/05/23 1700

## 2023-06-05 NOTE — Discharge Instructions (Addendum)
Please call your nephrologist on Monday for further recommendations and to arrange a follow-up appointment.  Please drink plenty of fluids and obtain plenty of rest.  Return to the emergency department for any symptom personally concerning to yourself.  You may use the Lidoderm patch as needed for the back pain over the next few days, and/or take Tylenol.  Avoid ibuprofen or other NSAIDs.  Take the prednisone as prescribed.

## 2023-06-05 NOTE — ED Notes (Addendum)
X1 attempt of IV at this time. Vein blew. Pt is a hard stick. Blood work obtained but unable to fill both tubes. Will attempt another IV after X-rays.

## 2023-06-05 NOTE — ED Provider Notes (Signed)
[facility] Provider Note    Event Date/Time   First MD Initiated Contact with Patient 06/05/23 1259     (approximate)  History   Chief Complaint: Hypotension  HPI  Nainoa L Folk is a 57 y.o. male with a past medical history of diabetes, hypertension, hyperlipidemia, kidney transplant followed at [facility], presents to the emergency department for weakness low blood pressure and back pain.  According to the patient this past Tuesday (5 days ago) patient became lightheaded weak and had a fall.  Since that time he has been experiencing pain in the upper right back.  He continues to have weakness and feels lightheaded.  Patient denies any fever cough or congestion.  Denies any abdominal pain vomiting or diarrhea.  No black or bloody stool.  Patient states he has been eating and drinking normal.  Patient is a kidney transplant patient followed at [facility].  Physical Exam   Triage Vital Signs: ED Triage Vitals  Encounter Vitals Group     BP 06/05/23 1248 (!) 84/69     Systolic BP Percentile --      Diastolic BP Percentile --      Pulse Rate 06/05/23 1248 (!) 109     Resp 06/05/23 1248 20     Temp 06/05/23 1248 97.8 F (36.6 C)     Temp Source 06/05/23 1248 Oral     SpO2 06/05/23 1248 98 %     Weight 06/05/23 1246 163 lb (73.9 kg)     Height 06/05/23 1246 5\' 9"  (1.753 m)     Head Circumference --      Peak Flow --      Pain Score 06/05/23 1245 6     Pain Loc --      Pain Education --      Exclude from Growth Chart --     Most recent vital signs: Vitals:   06/05/23 1248  BP: (!) 84/69  Pulse: (!) 109  Resp: 20  Temp: 97.8 F (36.6 C)  SpO2: 98%    General: Awake, no distress.  Very dry mucous membranes. CV:  Good peripheral perfusion.  Regular rate and rhythm  Resp:  Normal effort.  Equal breath sounds bilaterally.  Abd:  No distention.  Soft, nontender.  No rebound or guarding. Other:  Lower extremity edema or tenderness.  Moderate mid thoracic and  right posterior chest wall tenderness.   ED Results / Procedures / Treatments   EKG  EKG viewed and interpreted by myself shows a sinus tachycardia at 107 bpm with a narrow QRS, normal axis, normal intervals, nonspecific ST findings.  RADIOLOGY  I have reviewed the chest x-ray images.  No obvious consolidation on my evaluation. Radiology has read the x-ray as negative for acute displaced rib fracture or cardiopulmonary finding. T-spine x-ray negative   MEDICATIONS ORDERED IN ED: Medications  sodium chloride 0.9 % bolus 1,000 mL (has no administration in time range)     IMPRESSION / MDM / ASSESSMENT AND PLAN / ED COURSE  I reviewed the triage vital signs and the nursing notes.  Patient's presentation is most consistent with acute presentation with potential threat to life or bodily function.  Patient presents to the emergency department for weakness near syncope and back pain.  Patient states since Tuesday he has been feeling weak with near syncopal episodes on Tuesday he did have a fall and has been experiencing some right upper back pain.  Will obtain x-ray images of the T-spine and  right side of the rib cage as a precaution.  Given the patient's persistent hypotension we will begin IV hydration we will check labs including a CBC chemistry and troponin.  Will obtain an EKG urinalysis and continue to closely monitor.  Patient agreeable to plan of care.  Patient's workup is negative for fracture on his x-rays.  COVID/flu/RSV are negative.  Patient CBC is reassuring, chemistry is also reassuring with a normal creatinine 1.18.  Troponin is negative.  Patient is complaining of some "gout" in his hands and knees.  States a history of the same.  Uric acid is elevated.  However given the patient's kidney transplant status I discussed with the patient to follow-up with his nephrologist on Monday for further recommendations.  We will hydrate with an additional liter of fluid blood pressure  currently 104/77, is improving with IV fluids suspect more dehydration.  Urinalysis is pending to rule out UTI.  Patient care signed out to oncoming provider.  FINAL CLINICAL IMPRESSION(S) / ED DIAGNOSES   Dehydration Hypotension   Note:  This document was prepared using Dragon voice recognition software and may include unintentional dictation errors.   Minna Antis, MD 06/05/23 1451

## 2023-06-05 NOTE — ED Notes (Signed)
Lab called for a recollect of blood work. Reached out to [facility] for an ultrasound IV.

## 2023-11-26 IMAGING — DX DG TIBIA/FIBULA PORT 2V*L*
4 series · 4 of 4 positions shown · non-contrast
Comparison: Prior radiograph from 06/11/2021.

CLINICAL DATA: Follow-up examination status post ORIF.

EXAM:
PORTABLE LEFT TIBIA AND FIBULA - 2 VIEW

[tibia ap (1 of 2)]
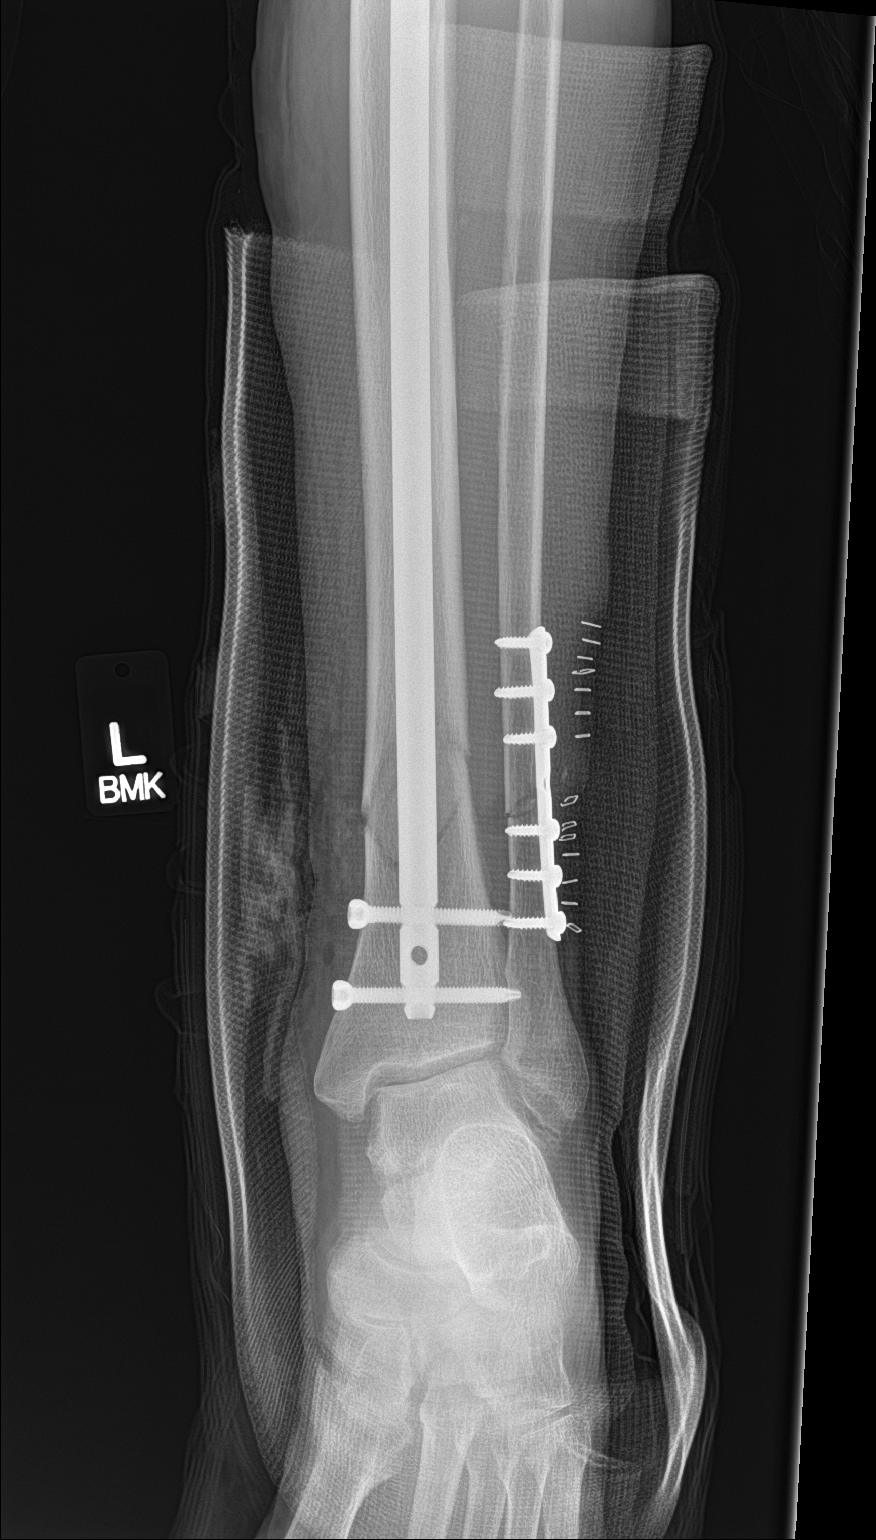

[tibia ap (2 of 2)]
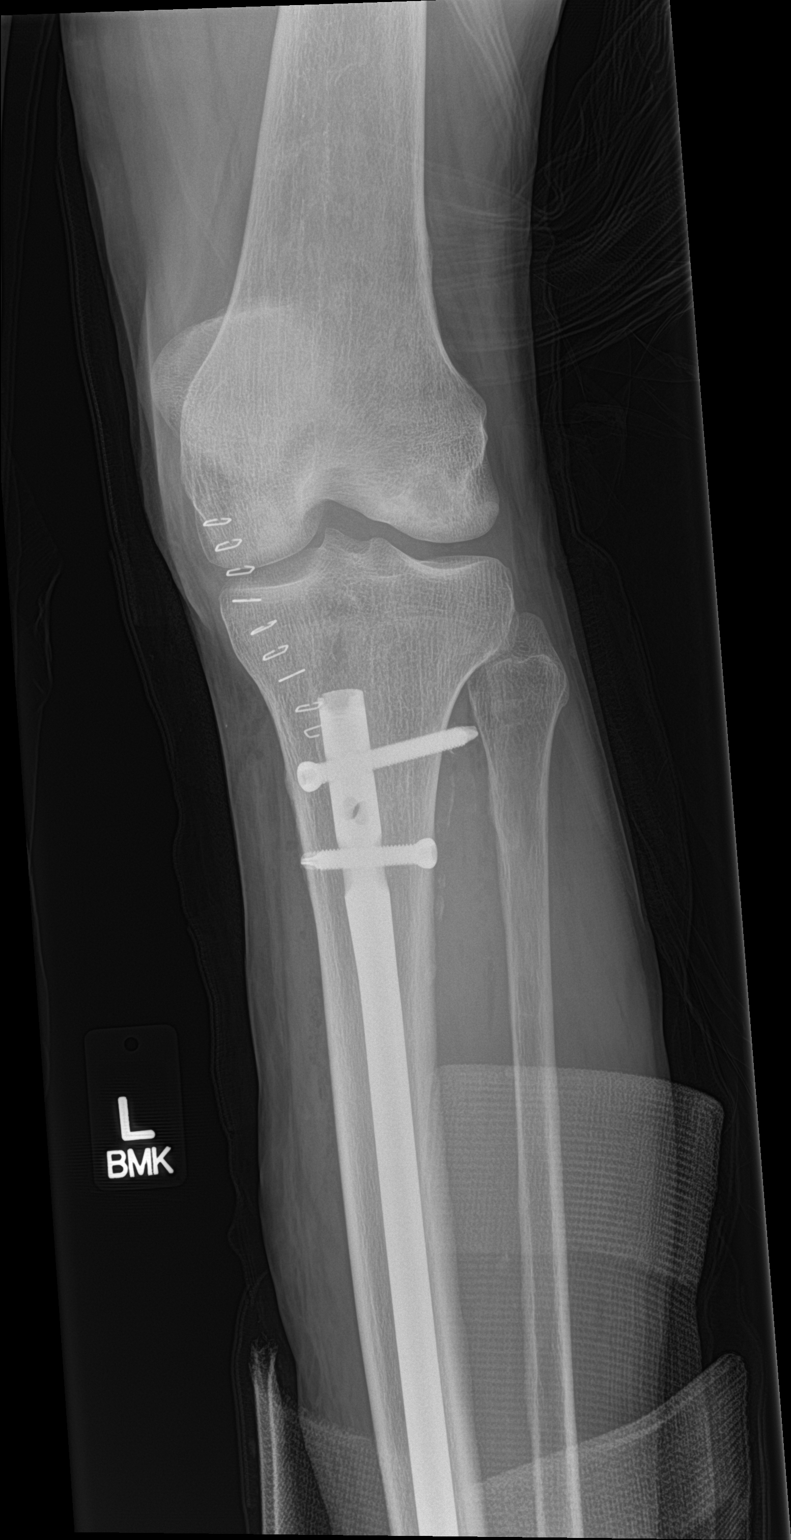

[tibia lat (1 of 2)]
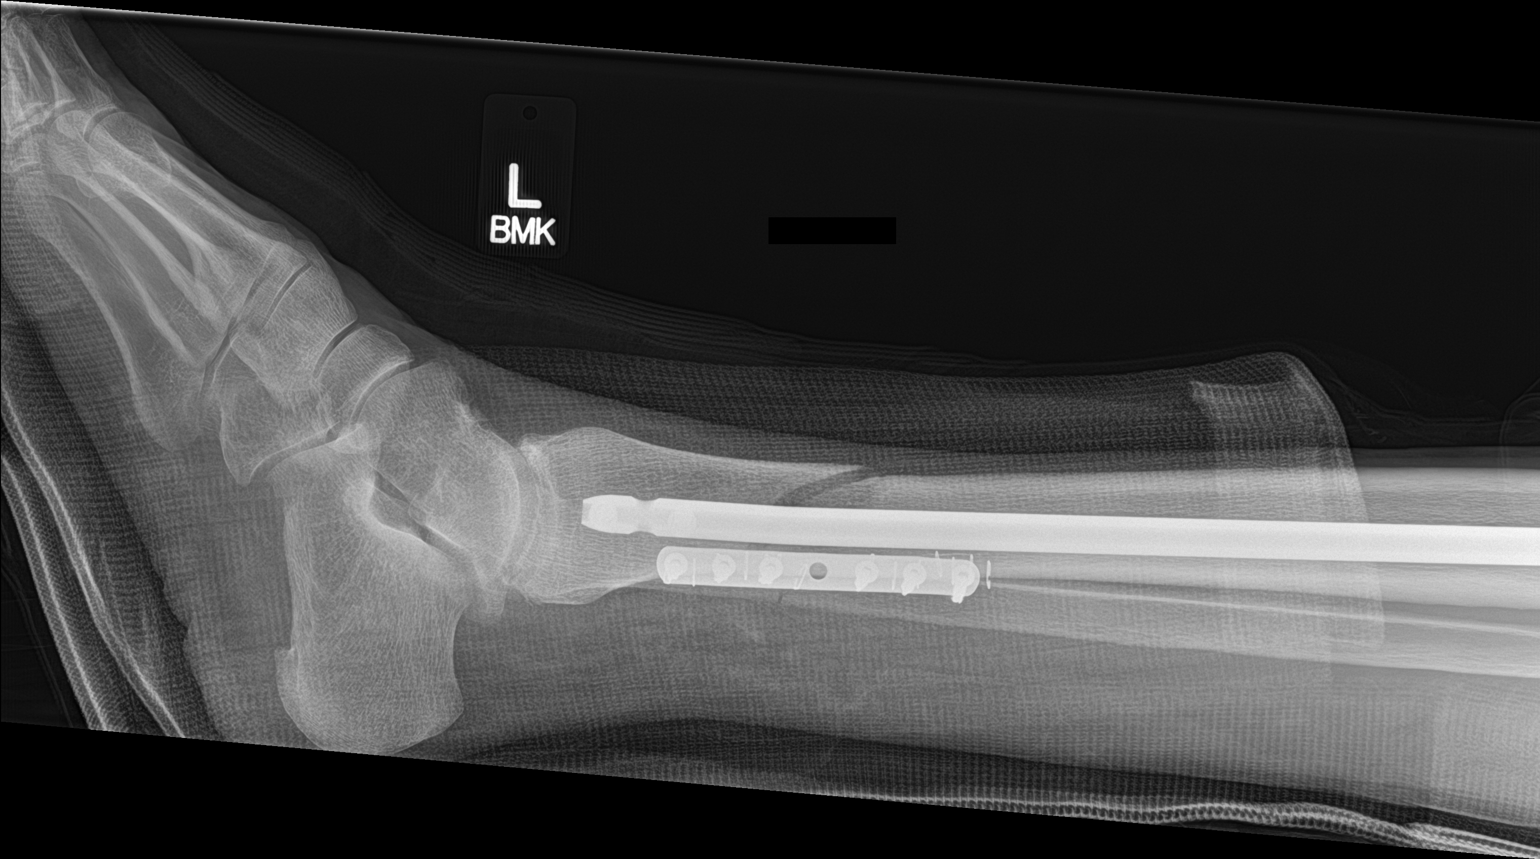

[tibia lat (2 of 2)]
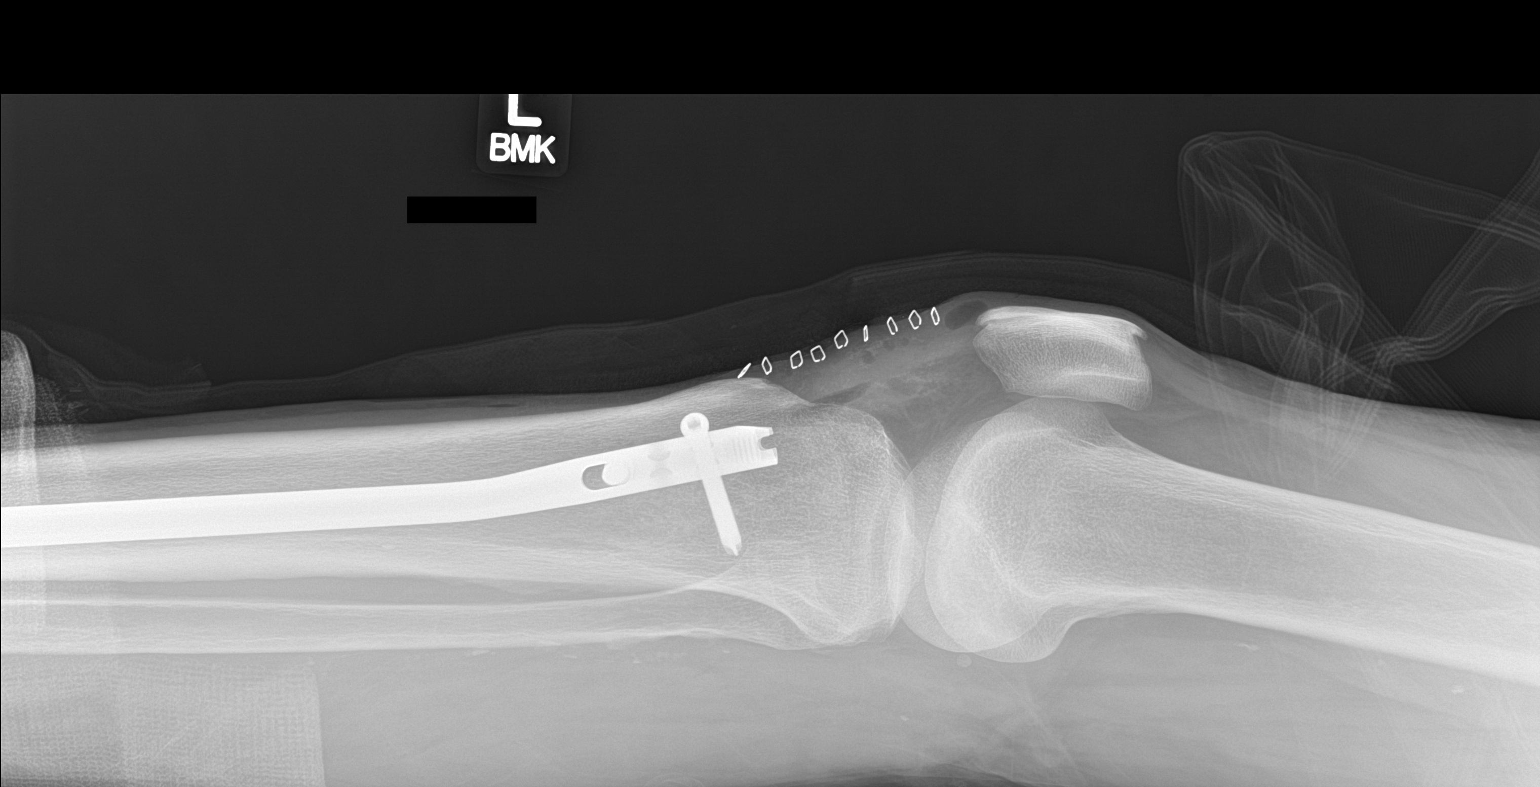

[4 of 4 positions shown; findings below may reference images not displayed]

FINDINGS: Splinting material overlies the lower left leg. Postoperative
changes from interval ORIF at the distal tibia and fibula. IM nail
with proximal and distal interlocking screws in place within the
tibia. Lateral plate screw fixation in place at the distal fibula.
Hardware appears well positioned and intact. Distal tibial/fibular
fractures in gross anatomic alignment status post fixation.
Overlying soft tissue swelling and skin staples with scattered
postoperative emphysema. No adverse features.
IMPRESSION: Postoperative changes from interval ORIF at the distal left tibia
and fibula without adverse features.
